# Patient Record
Sex: Male | Born: 1935 | Race: White | Hispanic: No | Marital: Married | Smoking: Former smoker
Health system: Southern US, Community
[De-identification: ages and names within clinical notes are randomized; demographics above are authoritative.]

## PROBLEM LIST (undated history)

## (undated) DIAGNOSIS — C801 Malignant (primary) neoplasm, unspecified: Secondary | ICD-10-CM

## (undated) DIAGNOSIS — Z95810 Presence of automatic (implantable) cardiac defibrillator: Secondary | ICD-10-CM

## (undated) DIAGNOSIS — I499 Cardiac arrhythmia, unspecified: Secondary | ICD-10-CM

## (undated) HISTORY — PX: PORTACATH PLACEMENT: SHX2246

## (undated) HISTORY — PX: PORT-A-CATH REMOVAL: SHX5289

## (undated) HISTORY — PX: HERNIA REPAIR: SHX51

---

## 2011-10-18 HISTORY — PX: COLON SURGERY: SHX602

## 2013-10-17 DIAGNOSIS — Z95810 Presence of automatic (implantable) cardiac defibrillator: Secondary | ICD-10-CM

## 2013-10-17 HISTORY — DX: Presence of automatic (implantable) cardiac defibrillator: Z95.810

## 2013-11-13 HISTORY — PX: OTHER SURGICAL HISTORY: SHX169

## 2014-10-20 DIAGNOSIS — C182 Malignant neoplasm of ascending colon: Secondary | ICD-10-CM | POA: Diagnosis not present

## 2014-10-21 DIAGNOSIS — C787 Secondary malignant neoplasm of liver and intrahepatic bile duct: Secondary | ICD-10-CM | POA: Diagnosis not present

## 2014-10-21 DIAGNOSIS — C7800 Secondary malignant neoplasm of unspecified lung: Secondary | ICD-10-CM | POA: Diagnosis not present

## 2014-10-21 DIAGNOSIS — C182 Malignant neoplasm of ascending colon: Secondary | ICD-10-CM | POA: Diagnosis not present

## 2014-11-03 DIAGNOSIS — Z01818 Encounter for other preprocedural examination: Secondary | ICD-10-CM | POA: Diagnosis not present

## 2014-11-03 DIAGNOSIS — C182 Malignant neoplasm of ascending colon: Secondary | ICD-10-CM | POA: Diagnosis not present

## 2014-11-04 DIAGNOSIS — C7800 Secondary malignant neoplasm of unspecified lung: Secondary | ICD-10-CM | POA: Diagnosis not present

## 2014-11-04 DIAGNOSIS — C182 Malignant neoplasm of ascending colon: Secondary | ICD-10-CM | POA: Diagnosis not present

## 2014-11-04 DIAGNOSIS — C787 Secondary malignant neoplasm of liver and intrahepatic bile duct: Secondary | ICD-10-CM | POA: Diagnosis not present

## 2014-11-11 DIAGNOSIS — C787 Secondary malignant neoplasm of liver and intrahepatic bile duct: Secondary | ICD-10-CM | POA: Diagnosis not present

## 2014-11-11 DIAGNOSIS — C182 Malignant neoplasm of ascending colon: Secondary | ICD-10-CM | POA: Diagnosis not present

## 2014-11-11 DIAGNOSIS — I4891 Unspecified atrial fibrillation: Secondary | ICD-10-CM | POA: Diagnosis not present

## 2014-11-18 DIAGNOSIS — C787 Secondary malignant neoplasm of liver and intrahepatic bile duct: Secondary | ICD-10-CM | POA: Diagnosis not present

## 2014-11-18 DIAGNOSIS — C7800 Secondary malignant neoplasm of unspecified lung: Secondary | ICD-10-CM | POA: Diagnosis not present

## 2014-11-18 DIAGNOSIS — C182 Malignant neoplasm of ascending colon: Secondary | ICD-10-CM | POA: Diagnosis not present

## 2014-12-01 DIAGNOSIS — Z950 Presence of cardiac pacemaker: Secondary | ICD-10-CM | POA: Diagnosis not present

## 2014-12-01 DIAGNOSIS — C182 Malignant neoplasm of ascending colon: Secondary | ICD-10-CM | POA: Diagnosis not present

## 2014-12-02 DIAGNOSIS — C182 Malignant neoplasm of ascending colon: Secondary | ICD-10-CM | POA: Diagnosis not present

## 2014-12-02 DIAGNOSIS — C787 Secondary malignant neoplasm of liver and intrahepatic bile duct: Secondary | ICD-10-CM | POA: Diagnosis not present

## 2014-12-02 DIAGNOSIS — R197 Diarrhea, unspecified: Secondary | ICD-10-CM | POA: Diagnosis not present

## 2014-12-02 DIAGNOSIS — C7800 Secondary malignant neoplasm of unspecified lung: Secondary | ICD-10-CM | POA: Diagnosis not present

## 2014-12-16 DIAGNOSIS — C189 Malignant neoplasm of colon, unspecified: Secondary | ICD-10-CM | POA: Diagnosis not present

## 2014-12-16 DIAGNOSIS — C7800 Secondary malignant neoplasm of unspecified lung: Secondary | ICD-10-CM | POA: Diagnosis not present

## 2014-12-16 DIAGNOSIS — Z950 Presence of cardiac pacemaker: Secondary | ICD-10-CM | POA: Diagnosis not present

## 2014-12-16 DIAGNOSIS — C787 Secondary malignant neoplasm of liver and intrahepatic bile duct: Secondary | ICD-10-CM | POA: Diagnosis not present

## 2014-12-18 DIAGNOSIS — C182 Malignant neoplasm of ascending colon: Secondary | ICD-10-CM | POA: Diagnosis not present

## 2014-12-24 DIAGNOSIS — C787 Secondary malignant neoplasm of liver and intrahepatic bile duct: Secondary | ICD-10-CM | POA: Diagnosis not present

## 2014-12-24 DIAGNOSIS — C7800 Secondary malignant neoplasm of unspecified lung: Secondary | ICD-10-CM | POA: Diagnosis not present

## 2014-12-24 DIAGNOSIS — Z950 Presence of cardiac pacemaker: Secondary | ICD-10-CM | POA: Diagnosis not present

## 2014-12-24 DIAGNOSIS — C182 Malignant neoplasm of ascending colon: Secondary | ICD-10-CM | POA: Diagnosis not present

## 2015-01-07 DIAGNOSIS — R21 Rash and other nonspecific skin eruption: Secondary | ICD-10-CM | POA: Diagnosis not present

## 2015-01-07 DIAGNOSIS — C182 Malignant neoplasm of ascending colon: Secondary | ICD-10-CM | POA: Diagnosis not present

## 2015-01-07 DIAGNOSIS — C787 Secondary malignant neoplasm of liver and intrahepatic bile duct: Secondary | ICD-10-CM | POA: Diagnosis not present

## 2015-01-13 DIAGNOSIS — I498 Other specified cardiac arrhythmias: Secondary | ICD-10-CM | POA: Diagnosis not present

## 2015-01-21 DIAGNOSIS — C182 Malignant neoplasm of ascending colon: Secondary | ICD-10-CM | POA: Diagnosis not present

## 2015-01-21 DIAGNOSIS — C787 Secondary malignant neoplasm of liver and intrahepatic bile duct: Secondary | ICD-10-CM | POA: Diagnosis not present

## 2015-02-04 DIAGNOSIS — R197 Diarrhea, unspecified: Secondary | ICD-10-CM | POA: Diagnosis not present

## 2015-02-04 DIAGNOSIS — C182 Malignant neoplasm of ascending colon: Secondary | ICD-10-CM | POA: Diagnosis not present

## 2015-02-04 DIAGNOSIS — C787 Secondary malignant neoplasm of liver and intrahepatic bile duct: Secondary | ICD-10-CM | POA: Diagnosis not present

## 2015-02-11 DIAGNOSIS — C189 Malignant neoplasm of colon, unspecified: Secondary | ICD-10-CM | POA: Diagnosis not present

## 2015-02-11 DIAGNOSIS — R918 Other nonspecific abnormal finding of lung field: Secondary | ICD-10-CM | POA: Diagnosis not present

## 2015-02-11 DIAGNOSIS — C182 Malignant neoplasm of ascending colon: Secondary | ICD-10-CM | POA: Diagnosis not present

## 2015-02-11 DIAGNOSIS — N400 Enlarged prostate without lower urinary tract symptoms: Secondary | ICD-10-CM | POA: Diagnosis not present

## 2015-02-11 DIAGNOSIS — C787 Secondary malignant neoplasm of liver and intrahepatic bile duct: Secondary | ICD-10-CM | POA: Diagnosis not present

## 2015-02-12 DIAGNOSIS — C787 Secondary malignant neoplasm of liver and intrahepatic bile duct: Secondary | ICD-10-CM | POA: Diagnosis not present

## 2015-02-12 DIAGNOSIS — C182 Malignant neoplasm of ascending colon: Secondary | ICD-10-CM | POA: Diagnosis not present

## 2015-02-18 DIAGNOSIS — C182 Malignant neoplasm of ascending colon: Secondary | ICD-10-CM | POA: Diagnosis not present

## 2015-02-18 DIAGNOSIS — Z5111 Encounter for antineoplastic chemotherapy: Secondary | ICD-10-CM | POA: Diagnosis not present

## 2015-02-18 DIAGNOSIS — C787 Secondary malignant neoplasm of liver and intrahepatic bile duct: Secondary | ICD-10-CM | POA: Diagnosis not present

## 2015-03-04 DIAGNOSIS — C182 Malignant neoplasm of ascending colon: Secondary | ICD-10-CM | POA: Diagnosis not present

## 2015-03-04 DIAGNOSIS — R197 Diarrhea, unspecified: Secondary | ICD-10-CM | POA: Diagnosis not present

## 2015-03-04 DIAGNOSIS — C787 Secondary malignant neoplasm of liver and intrahepatic bile duct: Secondary | ICD-10-CM | POA: Diagnosis not present

## 2015-03-11 DIAGNOSIS — C787 Secondary malignant neoplasm of liver and intrahepatic bile duct: Secondary | ICD-10-CM | POA: Diagnosis not present

## 2015-03-11 DIAGNOSIS — R197 Diarrhea, unspecified: Secondary | ICD-10-CM | POA: Diagnosis not present

## 2015-03-11 DIAGNOSIS — C182 Malignant neoplasm of ascending colon: Secondary | ICD-10-CM | POA: Diagnosis not present

## 2015-03-18 DIAGNOSIS — C787 Secondary malignant neoplasm of liver and intrahepatic bile duct: Secondary | ICD-10-CM | POA: Diagnosis not present

## 2015-03-18 DIAGNOSIS — C182 Malignant neoplasm of ascending colon: Secondary | ICD-10-CM | POA: Diagnosis not present

## 2015-03-18 DIAGNOSIS — D509 Iron deficiency anemia, unspecified: Secondary | ICD-10-CM | POA: Diagnosis not present

## 2015-04-01 DIAGNOSIS — C787 Secondary malignant neoplasm of liver and intrahepatic bile duct: Secondary | ICD-10-CM | POA: Diagnosis not present

## 2015-04-01 DIAGNOSIS — D649 Anemia, unspecified: Secondary | ICD-10-CM | POA: Diagnosis not present

## 2015-04-01 DIAGNOSIS — C182 Malignant neoplasm of ascending colon: Secondary | ICD-10-CM | POA: Diagnosis not present

## 2015-04-07 DIAGNOSIS — C182 Malignant neoplasm of ascending colon: Secondary | ICD-10-CM | POA: Diagnosis not present

## 2015-04-07 DIAGNOSIS — C787 Secondary malignant neoplasm of liver and intrahepatic bile duct: Secondary | ICD-10-CM | POA: Diagnosis not present

## 2015-04-07 DIAGNOSIS — I7090 Unspecified atherosclerosis: Secondary | ICD-10-CM | POA: Diagnosis not present

## 2015-04-07 DIAGNOSIS — Z85038 Personal history of other malignant neoplasm of large intestine: Secondary | ICD-10-CM | POA: Diagnosis not present

## 2015-04-08 DIAGNOSIS — C787 Secondary malignant neoplasm of liver and intrahepatic bile duct: Secondary | ICD-10-CM | POA: Diagnosis not present

## 2015-04-08 DIAGNOSIS — C182 Malignant neoplasm of ascending colon: Secondary | ICD-10-CM | POA: Diagnosis not present

## 2015-04-14 DIAGNOSIS — H2513 Age-related nuclear cataract, bilateral: Secondary | ICD-10-CM | POA: Diagnosis not present

## 2015-04-14 DIAGNOSIS — H5203 Hypermetropia, bilateral: Secondary | ICD-10-CM | POA: Diagnosis not present

## 2015-04-15 DIAGNOSIS — C19 Malignant neoplasm of rectosigmoid junction: Secondary | ICD-10-CM | POA: Diagnosis not present

## 2015-04-15 DIAGNOSIS — C182 Malignant neoplasm of ascending colon: Secondary | ICD-10-CM | POA: Diagnosis not present

## 2015-04-15 DIAGNOSIS — C787 Secondary malignant neoplasm of liver and intrahepatic bile duct: Secondary | ICD-10-CM | POA: Diagnosis not present

## 2015-04-16 DIAGNOSIS — Z5111 Encounter for antineoplastic chemotherapy: Secondary | ICD-10-CM | POA: Diagnosis not present

## 2015-04-16 DIAGNOSIS — C182 Malignant neoplasm of ascending colon: Secondary | ICD-10-CM | POA: Diagnosis not present

## 2015-04-16 DIAGNOSIS — C787 Secondary malignant neoplasm of liver and intrahepatic bile duct: Secondary | ICD-10-CM | POA: Diagnosis not present

## 2015-04-17 DIAGNOSIS — Z4502 Encounter for adjustment and management of automatic implantable cardiac defibrillator: Secondary | ICD-10-CM | POA: Diagnosis not present

## 2015-04-17 DIAGNOSIS — I498 Other specified cardiac arrhythmias: Secondary | ICD-10-CM | POA: Diagnosis not present

## 2015-04-29 DIAGNOSIS — C182 Malignant neoplasm of ascending colon: Secondary | ICD-10-CM | POA: Diagnosis not present

## 2015-04-29 DIAGNOSIS — C787 Secondary malignant neoplasm of liver and intrahepatic bile duct: Secondary | ICD-10-CM | POA: Diagnosis not present

## 2015-04-30 DIAGNOSIS — C182 Malignant neoplasm of ascending colon: Secondary | ICD-10-CM | POA: Diagnosis not present

## 2015-04-30 DIAGNOSIS — C787 Secondary malignant neoplasm of liver and intrahepatic bile duct: Secondary | ICD-10-CM | POA: Diagnosis not present

## 2015-04-30 DIAGNOSIS — Z5111 Encounter for antineoplastic chemotherapy: Secondary | ICD-10-CM | POA: Diagnosis not present

## 2015-05-14 DIAGNOSIS — C182 Malignant neoplasm of ascending colon: Secondary | ICD-10-CM | POA: Diagnosis not present

## 2015-05-14 DIAGNOSIS — C787 Secondary malignant neoplasm of liver and intrahepatic bile duct: Secondary | ICD-10-CM | POA: Diagnosis not present

## 2015-05-28 DIAGNOSIS — R21 Rash and other nonspecific skin eruption: Secondary | ICD-10-CM | POA: Diagnosis not present

## 2015-05-28 DIAGNOSIS — C787 Secondary malignant neoplasm of liver and intrahepatic bile duct: Secondary | ICD-10-CM | POA: Diagnosis not present

## 2015-05-28 DIAGNOSIS — C182 Malignant neoplasm of ascending colon: Secondary | ICD-10-CM | POA: Diagnosis not present

## 2015-06-02 DIAGNOSIS — R918 Other nonspecific abnormal finding of lung field: Secondary | ICD-10-CM | POA: Diagnosis not present

## 2015-06-02 DIAGNOSIS — C801 Malignant (primary) neoplasm, unspecified: Secondary | ICD-10-CM | POA: Diagnosis not present

## 2015-06-02 DIAGNOSIS — C787 Secondary malignant neoplasm of liver and intrahepatic bile duct: Secondary | ICD-10-CM | POA: Diagnosis not present

## 2015-06-02 DIAGNOSIS — C182 Malignant neoplasm of ascending colon: Secondary | ICD-10-CM | POA: Diagnosis not present

## 2015-06-03 DIAGNOSIS — C787 Secondary malignant neoplasm of liver and intrahepatic bile duct: Secondary | ICD-10-CM | POA: Diagnosis not present

## 2015-06-03 DIAGNOSIS — R21 Rash and other nonspecific skin eruption: Secondary | ICD-10-CM | POA: Diagnosis not present

## 2015-06-03 DIAGNOSIS — C182 Malignant neoplasm of ascending colon: Secondary | ICD-10-CM | POA: Diagnosis not present

## 2015-06-17 DIAGNOSIS — C787 Secondary malignant neoplasm of liver and intrahepatic bile duct: Secondary | ICD-10-CM | POA: Diagnosis not present

## 2015-06-17 DIAGNOSIS — C182 Malignant neoplasm of ascending colon: Secondary | ICD-10-CM | POA: Diagnosis not present

## 2015-06-19 DIAGNOSIS — C182 Malignant neoplasm of ascending colon: Secondary | ICD-10-CM | POA: Diagnosis not present

## 2015-06-19 DIAGNOSIS — Z452 Encounter for adjustment and management of vascular access device: Secondary | ICD-10-CM | POA: Diagnosis not present

## 2015-06-19 DIAGNOSIS — C787 Secondary malignant neoplasm of liver and intrahepatic bile duct: Secondary | ICD-10-CM | POA: Diagnosis not present

## 2015-07-08 DIAGNOSIS — C182 Malignant neoplasm of ascending colon: Secondary | ICD-10-CM | POA: Diagnosis not present

## 2015-07-08 DIAGNOSIS — C787 Secondary malignant neoplasm of liver and intrahepatic bile duct: Secondary | ICD-10-CM | POA: Diagnosis not present

## 2015-07-10 DIAGNOSIS — Z452 Encounter for adjustment and management of vascular access device: Secondary | ICD-10-CM | POA: Diagnosis not present

## 2015-07-10 DIAGNOSIS — C787 Secondary malignant neoplasm of liver and intrahepatic bile duct: Secondary | ICD-10-CM | POA: Diagnosis not present

## 2015-07-10 DIAGNOSIS — C182 Malignant neoplasm of ascending colon: Secondary | ICD-10-CM | POA: Diagnosis not present

## 2015-07-17 DIAGNOSIS — C182 Malignant neoplasm of ascending colon: Secondary | ICD-10-CM | POA: Diagnosis not present

## 2015-07-17 DIAGNOSIS — Z23 Encounter for immunization: Secondary | ICD-10-CM | POA: Diagnosis not present

## 2015-07-29 DIAGNOSIS — C182 Malignant neoplasm of ascending colon: Secondary | ICD-10-CM | POA: Diagnosis not present

## 2015-07-29 DIAGNOSIS — C787 Secondary malignant neoplasm of liver and intrahepatic bile duct: Secondary | ICD-10-CM | POA: Diagnosis not present

## 2015-07-31 DIAGNOSIS — C182 Malignant neoplasm of ascending colon: Secondary | ICD-10-CM | POA: Diagnosis not present

## 2015-07-31 DIAGNOSIS — Z452 Encounter for adjustment and management of vascular access device: Secondary | ICD-10-CM | POA: Diagnosis not present

## 2015-07-31 DIAGNOSIS — C787 Secondary malignant neoplasm of liver and intrahepatic bile duct: Secondary | ICD-10-CM | POA: Diagnosis not present

## 2015-08-17 DIAGNOSIS — C182 Malignant neoplasm of ascending colon: Secondary | ICD-10-CM | POA: Diagnosis not present

## 2015-08-19 DIAGNOSIS — C787 Secondary malignant neoplasm of liver and intrahepatic bile duct: Secondary | ICD-10-CM | POA: Diagnosis not present

## 2015-08-19 DIAGNOSIS — C182 Malignant neoplasm of ascending colon: Secondary | ICD-10-CM | POA: Diagnosis not present

## 2015-08-21 DIAGNOSIS — Z452 Encounter for adjustment and management of vascular access device: Secondary | ICD-10-CM | POA: Diagnosis not present

## 2015-08-21 DIAGNOSIS — C182 Malignant neoplasm of ascending colon: Secondary | ICD-10-CM | POA: Diagnosis not present

## 2015-08-21 DIAGNOSIS — C787 Secondary malignant neoplasm of liver and intrahepatic bile duct: Secondary | ICD-10-CM | POA: Diagnosis not present

## 2015-09-14 DIAGNOSIS — R918 Other nonspecific abnormal finding of lung field: Secondary | ICD-10-CM | POA: Diagnosis not present

## 2015-09-14 DIAGNOSIS — J90 Pleural effusion, not elsewhere classified: Secondary | ICD-10-CM | POA: Diagnosis not present

## 2015-09-14 DIAGNOSIS — C787 Secondary malignant neoplasm of liver and intrahepatic bile duct: Secondary | ICD-10-CM | POA: Diagnosis not present

## 2015-09-14 DIAGNOSIS — R591 Generalized enlarged lymph nodes: Secondary | ICD-10-CM | POA: Diagnosis not present

## 2015-09-14 DIAGNOSIS — C182 Malignant neoplasm of ascending colon: Secondary | ICD-10-CM | POA: Diagnosis not present

## 2015-09-16 DIAGNOSIS — C787 Secondary malignant neoplasm of liver and intrahepatic bile duct: Secondary | ICD-10-CM | POA: Diagnosis not present

## 2015-09-16 DIAGNOSIS — C182 Malignant neoplasm of ascending colon: Secondary | ICD-10-CM | POA: Diagnosis not present

## 2015-09-23 DIAGNOSIS — C182 Malignant neoplasm of ascending colon: Secondary | ICD-10-CM | POA: Diagnosis not present

## 2015-09-23 DIAGNOSIS — Z5111 Encounter for antineoplastic chemotherapy: Secondary | ICD-10-CM | POA: Diagnosis not present

## 2015-09-23 DIAGNOSIS — C787 Secondary malignant neoplasm of liver and intrahepatic bile duct: Secondary | ICD-10-CM | POA: Diagnosis not present

## 2015-09-25 DIAGNOSIS — C787 Secondary malignant neoplasm of liver and intrahepatic bile duct: Secondary | ICD-10-CM | POA: Diagnosis not present

## 2015-09-25 DIAGNOSIS — C182 Malignant neoplasm of ascending colon: Secondary | ICD-10-CM | POA: Diagnosis not present

## 2015-09-25 DIAGNOSIS — Z452 Encounter for adjustment and management of vascular access device: Secondary | ICD-10-CM | POA: Diagnosis not present

## 2015-10-07 DIAGNOSIS — Z4502 Encounter for adjustment and management of automatic implantable cardiac defibrillator: Secondary | ICD-10-CM | POA: Diagnosis not present

## 2015-10-07 DIAGNOSIS — I498 Other specified cardiac arrhythmias: Secondary | ICD-10-CM | POA: Diagnosis not present

## 2015-10-14 DIAGNOSIS — C787 Secondary malignant neoplasm of liver and intrahepatic bile duct: Secondary | ICD-10-CM | POA: Diagnosis not present

## 2015-10-14 DIAGNOSIS — C182 Malignant neoplasm of ascending colon: Secondary | ICD-10-CM | POA: Diagnosis not present

## 2015-10-17 DIAGNOSIS — C182 Malignant neoplasm of ascending colon: Secondary | ICD-10-CM | POA: Diagnosis not present

## 2015-11-04 DIAGNOSIS — C182 Malignant neoplasm of ascending colon: Secondary | ICD-10-CM | POA: Diagnosis not present

## 2015-11-04 DIAGNOSIS — D649 Anemia, unspecified: Secondary | ICD-10-CM | POA: Diagnosis not present

## 2015-11-04 DIAGNOSIS — C787 Secondary malignant neoplasm of liver and intrahepatic bile duct: Secondary | ICD-10-CM | POA: Diagnosis not present

## 2015-11-04 DIAGNOSIS — R040 Epistaxis: Secondary | ICD-10-CM | POA: Diagnosis not present

## 2015-11-04 DIAGNOSIS — D701 Agranulocytosis secondary to cancer chemotherapy: Secondary | ICD-10-CM

## 2015-11-25 DIAGNOSIS — G620 Drug-induced polyneuropathy: Secondary | ICD-10-CM | POA: Diagnosis not present

## 2015-11-25 DIAGNOSIS — C787 Secondary malignant neoplasm of liver and intrahepatic bile duct: Secondary | ICD-10-CM | POA: Diagnosis not present

## 2015-11-25 DIAGNOSIS — C189 Malignant neoplasm of colon, unspecified: Secondary | ICD-10-CM | POA: Diagnosis not present

## 2015-12-16 DIAGNOSIS — C189 Malignant neoplasm of colon, unspecified: Secondary | ICD-10-CM | POA: Diagnosis not present

## 2015-12-16 DIAGNOSIS — C787 Secondary malignant neoplasm of liver and intrahepatic bile duct: Secondary | ICD-10-CM | POA: Diagnosis not present

## 2015-12-18 ENCOUNTER — Other Ambulatory Visit: Payer: Self-pay | Admitting: Oncology

## 2015-12-18 DIAGNOSIS — C189 Malignant neoplasm of colon, unspecified: Secondary | ICD-10-CM

## 2015-12-18 DIAGNOSIS — C787 Secondary malignant neoplasm of liver and intrahepatic bile duct: Principal | ICD-10-CM

## 2015-12-22 ENCOUNTER — Ambulatory Visit
Admission: RE | Admit: 2015-12-22 | Discharge: 2015-12-22 | Disposition: A | Discharge status: Home / Self Care | Payer: Medicare Other | Source: Ambulatory Visit | Attending: Oncology | Admitting: Oncology

## 2015-12-22 ENCOUNTER — Other Ambulatory Visit: Payer: Self-pay | Admitting: Interventional Radiology

## 2015-12-22 DIAGNOSIS — C189 Malignant neoplasm of colon, unspecified: Secondary | ICD-10-CM

## 2015-12-22 DIAGNOSIS — C787 Secondary malignant neoplasm of liver and intrahepatic bile duct: Principal | ICD-10-CM

## 2015-12-22 HISTORY — DX: Cardiac arrhythmia, unspecified: I49.9

## 2015-12-22 HISTORY — DX: Presence of automatic (implantable) cardiac defibrillator: Z95.810

## 2015-12-22 HISTORY — DX: Malignant (primary) neoplasm, unspecified: C80.1

## 2015-12-22 NOTE — Consult Note (Signed)
Chief Complaint: Patient was seen in consultation today for  Chief Complaint  Patient presents with  . Advice Only    Consult for Y-90 SIRT   at the request of Lewis,Dequincy A  Referring Physician(s): Lewis,Dequincy A  History of Present Illness: Erik Foster is a 82 y.o. male with a history of colon cancer initially diagnosed in 2014. He has hepatic metastatic disease which has been relatively well controlled as he has progressed through multiple chemotherapeutic regimens. Most recently he was on FOLFOX with Avastin (last dose approximately 3 weeks previously).  Unfortunately, his most recent surveillance study demonstrates significant progression of his hepatic disease which is now clearly no longer responding to FOLFOX based therapy. At this time, he has no additional chemotherapeutic options outside of the possibility of clinical trials.  He remains quite vivacious for his age. He is able to perform all of his activities of daily living and continues to perform more aggressive activities including yardwork and recent removal of 2 trees from his property. He is accompanied by his wife today who is very supportive. 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  Overall, he feels he has tolerated multiple rounds of chemotherapy with fairly minimal side effect and has maintained an extremely positive attitude. He is very realistic about his overall prognosis and understands that radial embolization is not a curative therapy but is rather palliative. He is at peace with his future mortality and wants to extend his life as long as possible to help his wife prepare for the time when he will no longer be around.  He denies new weight loss, fever, chills, abdominal pain, nausea or vomiting.  Past Medical History  Diagnosis Date  . Cancer ([place])   . Dysrhythmia   . AICD  (automatic cardioverter/defibrillator) present     Past Surgical History  Procedure Laterality Date  . Colon surgery  2013  . Biv icd st jude zuadra assura  11/13/2013    . Hernia repair Right     Inguinal hernia repair    Allergies: Review of patient's allergies indicates no known allergies.  Medications: Prior to Admission medications   Medication Sig Start Date End Date Taking? Authorizing Provider  amiodarone (PACERONE) 200 MG tablet Take 200 mg by mouth daily.   Yes Historical Provider, MD  apixaban (ELIQUIS) 2.5 MG TABS tablet Take 2.5 mg by mouth 2 (two) times daily.   Yes Historical Provider, MD  atorvastatin (LIPITOR) 20 MG tablet Take 20 mg by mouth daily.   Yes Historical Provider, MD  lisinopril (PRINIVIL,ZESTRIL) 2.5 MG tablet Take 2.5 mg by mouth daily.   Yes Historical Provider, MD  Multiple Vitamin (MULTIVITAMIN) tablet Take 1 tablet by mouth daily.   Yes Historical Provider, MD     No family history on file.  Social History   Social History  . Marital Status: Married    Spouse Name: N/A  . Number of Children: N/A  . Years of Education: N/A   Social History Main Topics  . Smoking status: Former Smoker -- 0.25 packs/day    Types: Cigarettes    Start date: 12/21/1953    Quit date: 12/22/1963  . Smokeless tobacco: Never Used  . Alcohol Use: No  . Drug Use: No  . Sexual Activity: Not Asked   Other Topics Concern  . None   Social History Narrative  .  None    ECOG Status: 1 - Symptomatic but completely ambulatory  Review of Systems: A 12 point ROS discussed and pertinent positives are indicated in the HPI above.  All other systems are negative.  Review of Systems  Vital Signs: BP 134/69 mmHg  Pulse 69  Temp(Src) 98.8 F (37.1 C) (Oral)  Resp 14  Ht 6\' 1"  (1.854 m)  Wt 193 lb (87.544 kg)  BMI 25.47 kg/m2  SpO2 100%  Physical Exam  Constitutional: He is oriented to person, place, and time. He appears well-developed and well-nourished. No  distress.  HENT:  Head: Atraumatic.  Eyes: No scleral icterus.  Cardiovascular: Normal rate and regular rhythm.   Pulmonary/Chest: Effort normal.  Abdominal: Soft. He exhibits no distension. There is no tenderness.  Neurological: He is alert and oriented to person, place, and time.  Skin: Skin is warm and dry.  Psychiatric: He has a normal mood and affect. His behavior is normal.     Imaging: No results found.  Labs:  CBC: No results for input(s): WBC, HGB, HCT, PLT in the last 8760 hours.  COAGS: No results for input(s): INR, APTT in the last 8760 hours.  BMP: No results for input(s): NA, K, CL, CO2, GLUCOSE, BUN, CALCIUM, CREATININE, GFRNONAA, GFRAA in the last 8760 hours.  Invalid input(s): CMP  LIVER FUNCTION TESTS: No results for input(s): BILITOT, AST, ALT, ALKPHOS, PROT, ALBUMIN in the last 8760 hours.  TUMOR MARKERS: No results for input(s): AFPTM, CEA, CA199, CHROMGRNA in the last 8760 hours.  Assessment and Plan:  Extremely pleasant and vivacious 82 year old male with a history of colon cancer metastasized to the liver. His liver metastatic disease has been well controlled for the past several years on FOLFOX based chemotherapy. However, his most recent surveillance CT scans now demonstrate progression of all sites of his liver disease. Additionally, his CEA levels have been rising.  At this time, he has no further chemotherapeutic options outside of potentially available clinical trials.   His performance status is excellent for his age. He is ECOG 1.  His hepatic reserve is also quite good. I feel he is an excellent candidate for transarterial radial embolization with Y 90. This should provide him with a significant survival benefit versus best supportive care and does not preclude him from receiving additional chemotherapy if he qualifies for a trial. His last Avastin dose was approximately 3 weeks ago.  1.) Set up for Y 90 mapping study as soon as possible.  This will be followed by a right lobar radioembolization and then a left lobar radioembolization approximately one month later.    Thank you for this interesting consult.  I greatly enjoyed meeting Erik Foster and look forward to participating in their care.  A copy of this report was sent to the requesting provider on this date.  Electronically Signed: Jacqulynn Cadet 12/22/2015, 9:28 PM   I spent a total of  40 Minutes  in face to face in clinical consultation, greater than 50% of which was counseling/coordinating care for colon cancer metastasized to liver.

## 2015-12-23 ENCOUNTER — Other Ambulatory Visit: Payer: Self-pay | Admitting: Interventional Radiology

## 2015-12-23 DIAGNOSIS — C189 Malignant neoplasm of colon, unspecified: Secondary | ICD-10-CM

## 2015-12-23 DIAGNOSIS — C787 Secondary malignant neoplasm of liver and intrahepatic bile duct: Principal | ICD-10-CM

## 2015-12-28 ENCOUNTER — Other Ambulatory Visit: Payer: Self-pay | Admitting: Interventional Radiology

## 2015-12-28 DIAGNOSIS — C787 Secondary malignant neoplasm of liver and intrahepatic bile duct: Principal | ICD-10-CM

## 2015-12-28 DIAGNOSIS — C189 Malignant neoplasm of colon, unspecified: Secondary | ICD-10-CM

## 2015-12-30 DIAGNOSIS — C189 Malignant neoplasm of colon, unspecified: Secondary | ICD-10-CM | POA: Diagnosis not present

## 2015-12-30 DIAGNOSIS — C787 Secondary malignant neoplasm of liver and intrahepatic bile duct: Secondary | ICD-10-CM | POA: Diagnosis not present

## 2016-01-04 ENCOUNTER — Other Ambulatory Visit: Payer: Self-pay | Admitting: General Surgery

## 2016-01-05 ENCOUNTER — Encounter (HOSPITAL_COMMUNITY): Payer: Self-pay

## 2016-01-05 ENCOUNTER — Ambulatory Visit (HOSPITAL_COMMUNITY)
Admission: RE | Admit: 2016-01-05 | Discharge: 2016-01-05 | Disposition: A | Discharge status: Home / Self Care | Payer: Medicare Other | Source: Ambulatory Visit | Attending: Interventional Radiology | Admitting: Interventional Radiology

## 2016-01-05 ENCOUNTER — Encounter (HOSPITAL_COMMUNITY)
Admission: RE | Admit: 2016-01-05 | Discharge: 2016-01-05 | Disposition: A | Discharge status: Home / Self Care | Payer: Medicare Other | Source: Ambulatory Visit | Attending: Interventional Radiology | Admitting: Interventional Radiology

## 2016-01-05 ENCOUNTER — Other Ambulatory Visit: Payer: Self-pay | Admitting: Interventional Radiology

## 2016-01-05 DIAGNOSIS — C787 Secondary malignant neoplasm of liver and intrahepatic bile duct: Principal | ICD-10-CM

## 2016-01-05 DIAGNOSIS — Z9221 Personal history of antineoplastic chemotherapy: Secondary | ICD-10-CM | POA: Diagnosis not present

## 2016-01-05 DIAGNOSIS — C189 Malignant neoplasm of colon, unspecified: Secondary | ICD-10-CM

## 2016-01-05 DIAGNOSIS — Z87891 Personal history of nicotine dependence: Secondary | ICD-10-CM | POA: Insufficient documentation

## 2016-01-05 DIAGNOSIS — Z95810 Presence of automatic (implantable) cardiac defibrillator: Secondary | ICD-10-CM | POA: Diagnosis not present

## 2016-01-05 LAB — COMPREHENSIVE METABOLIC PANEL
ALBUMIN: 2.9 g/dL — AB (ref 3.5–5.0)
ALT: 37 U/L (ref 17–63)
ANION GAP: 11 (ref 5–15)
AST: 47 U/L — AB (ref 15–41)
Alkaline Phosphatase: 225 U/L — ABNORMAL HIGH (ref 38–126)
BILIRUBIN TOTAL: 1 mg/dL (ref 0.3–1.2)
BUN: 22 mg/dL — ABNORMAL HIGH (ref 6–20)
CALCIUM: 9.1 mg/dL (ref 8.9–10.3)
CO2: 23 mmol/L (ref 22–32)
Chloride: 106 mmol/L (ref 101–111)
Creatinine, Ser: 1.46 mg/dL — ABNORMAL HIGH (ref 0.61–1.24)
GFR calc Af Amer: 50 mL/min — ABNORMAL LOW (ref >60)
GFR calc non Af Amer: 43 mL/min — ABNORMAL LOW (ref >60)
GLUCOSE: 112 mg/dL — AB (ref 65–99)
Potassium: 4.3 mmol/L (ref 3.5–5.1)
SODIUM: 140 mmol/L (ref 135–145)
TOTAL PROTEIN: 7.7 g/dL (ref 6.5–8.1)

## 2016-01-05 LAB — PROTIME-INR
INR: 1.17 (ref 0.00–1.49)
Prothrombin Time: 15.1 seconds (ref 11.6–15.2)

## 2016-01-05 LAB — CBC WITH DIFFERENTIAL/PLATELET
BASOS ABS: 0 10*3/uL (ref 0.0–0.1)
BASOS PCT: 0 %
EOS ABS: 0.2 10*3/uL (ref 0.0–0.7)
EOS PCT: 2 %
HEMATOCRIT: 33.5 % — AB (ref 39.0–52.0)
Hemoglobin: 10.3 g/dL — ABNORMAL LOW (ref 13.0–17.0)
Lymphocytes Relative: 12 %
Lymphs Abs: 1.2 10*3/uL (ref 0.7–4.0)
MCH: 28.5 pg (ref 26.0–34.0)
MCHC: 30.7 g/dL (ref 30.0–36.0)
MCV: 92.5 fL (ref 78.0–100.0)
MONO ABS: 0.8 10*3/uL (ref 0.1–1.0)
MONOS PCT: 8 %
Neutro Abs: 7.6 10*3/uL (ref 1.7–7.7)
Neutrophils Relative %: 78 %
PLATELETS: 224 10*3/uL (ref 150–400)
RBC: 3.62 MIL/uL — ABNORMAL LOW (ref 4.22–5.81)
RDW: 17.1 % — AB (ref 11.5–15.5)
WBC: 9.8 10*3/uL (ref 4.0–10.5)

## 2016-01-05 MED ORDER — VERAPAMIL HCL 2.5 MG/ML IV SOLN
INTRAVENOUS | Status: AC | PRN
  Administered 2016-01-05: 2.5 mg via INTRA_ARTERIAL

## 2016-01-05 MED ORDER — SODIUM CHLORIDE 0.9% FLUSH: 3.0000 mL | INTRAVENOUS | Status: DC | PRN

## 2016-01-05 MED ORDER — IODIXANOL 320 MG/ML IV SOLN
50.0000 mL | Freq: Once | INTRAVENOUS | Status: AC | PRN
  Administered 2016-01-05: 12 mL via INTRAVENOUS

## 2016-01-05 MED ORDER — HEPARIN SODIUM (PORCINE) 1000 UNIT/ML IJ SOLN
INTRAMUSCULAR | Status: AC | PRN
Start: 2016-01-05 — End: 2016-01-05
  Administered 2016-01-05: 3000 [IU] via INTRA_ARTERIAL

## 2016-01-05 MED ORDER — LIDOCAINE HCL 1 % IJ SOLN
INTRAMUSCULAR | Status: AC
  Filled 2016-01-05: qty 20

## 2016-01-05 MED ORDER — HEPARIN SODIUM (PORCINE) 1000 UNIT/ML IJ SOLN
INTRAMUSCULAR | Status: AC
  Filled 2016-01-05: qty 1

## 2016-01-05 MED ORDER — MIDAZOLAM HCL 2 MG/2ML IJ SOLN
INTRAMUSCULAR | Status: AC
  Filled 2016-01-05: qty 6

## 2016-01-05 MED ORDER — SODIUM CHLORIDE 0.9 % IV SOLN: 250.0000 mL | INTRAVENOUS | Status: DC | PRN

## 2016-01-05 MED ORDER — SODIUM CHLORIDE 0.9% FLUSH
3.0000 mL | Freq: Two times a day (BID) | INTRAVENOUS | Status: DC
Start: 2016-01-05 — End: 2016-01-06

## 2016-01-05 MED ORDER — ONDANSETRON HCL 4 MG/2ML IJ SOLN: 4.0000 mg | Freq: Four times a day (QID) | INTRAMUSCULAR | Status: DC | PRN

## 2016-01-05 MED ORDER — ACETAMINOPHEN 325 MG PO TABS
650.0000 mg | ORAL_TABLET | ORAL | Status: DC | PRN
  Filled 2016-01-05: qty 2

## 2016-01-05 MED ORDER — NITROGLYCERIN 1 MG/10 ML FOR IR/CATH LAB
INTRA_ARTERIAL | Status: AC | PRN
  Administered 2016-01-05 (×2): 200 ug via INTRA_ARTERIAL

## 2016-01-05 MED ORDER — MIDAZOLAM HCL 2 MG/2ML IJ SOLN
INTRAMUSCULAR | Status: AC | PRN
  Administered 2016-01-05: 1 mg via INTRAVENOUS
  Administered 2016-01-05: 0.5 mg via INTRAVENOUS

## 2016-01-05 MED ORDER — FENTANYL CITRATE (PF) 100 MCG/2ML IJ SOLN
INTRAMUSCULAR | Status: AC
  Filled 2016-01-05: qty 4

## 2016-01-05 MED ORDER — IODIXANOL 320 MG/ML IV SOLN
200.0000 mL/kg | Freq: Once | INTRAVENOUS | Status: AC | PRN
  Administered 2016-01-05: 50 mL via INTRAVENOUS

## 2016-01-05 MED ORDER — VERAPAMIL HCL 2.5 MG/ML IV SOLN
INTRAVENOUS | Status: AC
  Filled 2016-01-05: qty 2

## 2016-01-05 MED ORDER — IODIXANOL 320 MG/ML IV SOLN
50.0000 mL | Freq: Once | INTRAVENOUS | Status: AC | PRN
  Administered 2016-01-05: 20 mL via INTRAVENOUS

## 2016-01-05 MED ORDER — FENTANYL CITRATE (PF) 100 MCG/2ML IJ SOLN
INTRAMUSCULAR | Status: AC | PRN
  Administered 2016-01-05: 50 ug via INTRAVENOUS

## 2016-01-05 MED ORDER — SODIUM CHLORIDE 0.9 % IV SOLN: INTRAVENOUS | Status: AC

## 2016-01-05 MED ORDER — SODIUM CHLORIDE 0.9 % IV SOLN
INTRAVENOUS | Status: DC
  Administered 2016-01-05: 08:00:00 via INTRAVENOUS

## 2016-01-05 NOTE — Procedures (Signed)
Interventional Radiology Procedure Note  Procedure:  1.) Hepatic arteriogram 2.) Coil embo GDA 3.) Injection Tc-MAA  Access: LEFT radial 32F, TR band   Complications: None  Estimated Blood Loss: None  Recommendations: - To nucs for imaging - Begin TR band take down in 90 minutes - ADAT  Signed,  Criselda Peaches, MD

## 2016-01-05 NOTE — Progress Notes (Addendum)
SCD's placed at 1300. 3 cc air removed from left radial TR band by Dr. Laurence Ferrari at [phone]. Site clean, dry, intact.

## 2016-01-05 NOTE — Progress Notes (Addendum)
3 cc air (remainder of air) removed without difficulty from left radial TR band at 1424. TR band removed. Bandaid placed. Site clean, dry, intact.

## 2016-01-05 NOTE — Sedation Documentation (Signed)
L wrist TR band unremarkable, CDI.  <3 sec capillary refill in thumb nailbed.

## 2016-01-05 NOTE — Sedation Documentation (Signed)
Patient denies pain and is resting comfortably.  

## 2016-01-05 NOTE — Sedation Documentation (Addendum)
L wrist TR band unremarkable, CDI.  <3 sec capillary refill in thumb nailbed.

## 2016-01-05 NOTE — Sedation Documentation (Signed)
Post imaging in Nuc Med compete.  Pt to be transported to [facility] for recovery.

## 2016-01-05 NOTE — Progress Notes (Signed)
3 cc air removed from left radial TR band at 1409. Site clean, dry, intact.

## 2016-01-05 NOTE — H&P (Signed)
Chief Complaint: colon carcinoma with liver met  Referring Physician:Dr. Lavera Guise  Supervising Physician: Jacqulynn Cadet  HPI: Erik Foster is an 82 y.o. male with a history of colon cancer that was diagnosed in 2014.  He developed liver metastases and has been treated with chemo, mostly recently FOLFOX and Avastin.  He was referred to Dr. Laurence Ferrari for evaluation for Y-90 treatment.  He is here today for the first part of the treatment which is the mapping study.    Past Medical History:  Past Medical History  Diagnosis Date  . Cancer ([place])   . Dysrhythmia   . AICD (automatic cardioverter/defibrillator) present 2015    Past Surgical History:  Past Surgical History  Procedure Laterality Date  . Colon surgery  2013  . Biv icd st jude zuadra assura  11/13/2013    . Hernia repair Right     Inguinal hernia repair    Family History: History reviewed. No pertinent family history.  Social History:  reports that he quit smoking about 52 years ago. His smoking use included Cigarettes. He started smoking about 62 years ago. He smoked 0.25 packs per day. He has never used smokeless tobacco. He reports that he does not drink alcohol or use illicit drugs.  Allergies: No Known Allergies  Medications:   Medication List    ASK your doctor about these medications        amiodarone 200 MG tablet  Commonly known as:  PACERONE  Take 200 mg by mouth daily.     atorvastatin 20 MG tablet  Commonly known as:  LIPITOR  Take 20 mg by mouth daily.     ELIQUIS 2.5 MG Tabs tablet  Generic drug:  apixaban  Take 2.5 mg by mouth 2 (two) times daily.     lisinopril 2.5 MG tablet  Commonly known as:  PRINIVIL,ZESTRIL  Take 2.5 mg by mouth daily.     multivitamin tablet  Take 1 tablet by mouth daily. Pt has been drinking boost instead        Please HPI for pertinent positives, otherwise complete 10 system ROS negative.  Mallampati Score: MD Evaluation Airway: WNL Heart:  Other (comments) Heart  comments: [place], has not been activated Abdomen: WNL Chest/ Lungs: WNL ASA  Classification: 3 Mallampati/Airway Score: Two  Physical Exam: BP 124/74 mmHg  Pulse 85  Temp(Src) 98.1 F (36.7 C) (Oral)  Resp 20  SpO2 97% There is no weight on file to calculate BMI. General: pleasant, WD, WN white male who is laying in bed in NAD HEENT: head is normocephalic, atraumatic.  Sclera are noninjected.  PERRL.  Ears and nose without any masses or lesions.  Mouth is pink and moist Heart: regular, rate, and rhythm.  Normal s1,s2. No obvious murmurs, gallops, or rubs noted.  Palpable radial and pedal pulses bilaterally.  AICD noted in left upper chest, PAC in right upper chest Lungs: CTAB, no wheezes, rhonchi, or rales noted.  Respiratory effort nonlabored Abd: soft, NT, ND, +BS, no masses, hernias, or organomegaly MS: all 4 extremities are symmetrical with no cyanosis, clubbing, or edema. Skin: warm and dry with no masses, lesions, or rashes Psych: A&Ox3 with an appropriate affect.   Labs: Results for orders placed or performed during the hospital encounter of 01/05/16 (from the past 48 hour(s))  CBC with Differential/Platelet     Status: Abnormal   Collection Time: 01/05/16  7:40 AM  Result Value Ref Range   WBC 9.8 4.0 -  10.5 K/uL   RBC 3.62 (L) 4.22 - 5.81 MIL/uL   Hemoglobin 10.3 (L) 13.0 - 17.0 g/dL   HCT 33.5 (L) 39.0 - 52.0 %   MCV 92.5 78.0 - 100.0 fL   MCH 28.5 26.0 - 34.0 pg   MCHC 30.7 30.0 - 36.0 g/dL   RDW 17.1 (H) 11.5 - 15.5 %   Platelets 224 150 - 400 K/uL   Neutrophils Relative % 78 %   Neutro Abs 7.6 1.7 - 7.7 K/uL   Lymphocytes Relative 12 %   Lymphs Abs 1.2 0.7 - 4.0 K/uL   Monocytes Relative 8 %   Monocytes Absolute 0.8 0.1 - 1.0 K/uL   Eosinophils Relative 2 %   Eosinophils Absolute 0.2 0.0 - 0.7 K/uL   Basophils Relative 0 %   Basophils Absolute 0.0 0.0 - 0.1 K/uL  Comprehensive metabolic panel     Status: Abnormal    Collection Time: 01/05/16  7:40 AM  Result Value Ref Range   Sodium 140 135 - 145 mmol/L   Potassium 4.3 3.5 - 5.1 mmol/L   Chloride 106 101 - 111 mmol/L   CO2 23 22 - 32 mmol/L   Glucose, Bld 112 (H) 65 - 99 mg/dL   BUN 22 (H) 6 - 20 mg/dL   Creatinine, Ser 1.46 (H) 0.61 - 1.24 mg/dL   Calcium 9.1 8.9 - 10.3 mg/dL   Total Protein 7.7 6.5 - 8.1 g/dL   Albumin 2.9 (L) 3.5 - 5.0 g/dL   AST 47 (H) 15 - 41 U/L   ALT 37 17 - 63 U/L   Alkaline Phosphatase 225 (H) 38 - 126 U/L   Total Bilirubin 1.0 0.3 - 1.2 mg/dL   GFR calc non Af Amer 43 (L) >60 mL/min   GFR calc Af Amer 50 (L) >60 mL/min    Comment: (NOTE) The eGFR has been calculated using the CKD EPI equation. This calculation has not been validated in all clinical situations. eGFR's persistently <60 mL/min signify possible Chronic Kidney Disease.    Anion gap 11 5 - 15  Protime-INR     Status: None   Collection Time: 01/05/16  7:40 AM  Result Value Ref Range   Prothrombin Time 15.1 11.6 - 15.2 seconds   INR 1.17 0.00 - 1.49    Imaging: No results found.  Assessment/Plan 1. Colon cancer with liver met -will proceed with Y90 mapping today -labs have been reviewed and [place] test has been completed.  He is an A.  Will likely proceed with left radial approach if US shows a large enough diameter.  If it is not large enough, then will proceed in groin. -Risks and Benefits discussed with the patient including, but not limited to bleeding, infection, vascular injury or contrast induced renal failure. All of the patient's questions were answered, patient is agreeable to proceed. Consent signed and in chart.   Thank you for this interesting consult.  I greatly enjoyed meeting Erik Foster and look forward to participating in their care.  A copy of this report was sent to the requesting provider on this date.  Electronically Signed: Henreitta Cea 01/05/2016, 8:50 AM   I spent a total of    30 minutes in face to face in  clinical consultation, greater than 50% of which was counseling/coordinating care for colon cancer with liver mets, Y90 mapping

## 2016-01-05 NOTE — Discharge Instructions (Signed)
Keep wrist elevated for 24 hours  Remove bandaid in 24 hours  May resume Eliquis tomorrow 01/06/16  Post Y-90 Radioembolization Discharge Instructions  You have been given a radioactive material during your procedure.  While it is safe for you to be discharged home from the hospital, you need to proceed directly home.    Do not use public transportation, including air travel, lasting more than 2 hours for 1 week.  Avoid crowded public places for 1 week.  Adult visitors should try to avoid close contact with you for 1 week.    Children and pregnant females should not visit or have close contact with you for 1 week.  Items that you touch are not radioactive.  Do not sleep in the same bed as your partner for 1 week, and a condom should be used for sexual activity during the first 24 hours.  Your blood may be radioactive and caution should be used if any bleeding occurs during the recovery period.  Body fluids may be radioactive for 24 hours.  Wash your hands after voiding.  Men should sit to urinate.  Dispose of any soiled materials (flush down toilet or place in trash at home) during the first day.  Drink 6 to 8 glasses of fluids per day for 5 days to hydrate yourself.  If you need to see a doctor during the first week, you must let them know that you were treated with yttrium-90 microspheres, and will be slightly radioactive.  They can call Interventional Radiology [phone] with any questions   .Angiogram, Care After These instructions give you information about caring for yourself after your procedure. Your doctor may also give you more specific instructions. Call your doctor if you have any problems or questions after your procedure.  HOME CARE  Take medicines only as told by your doctor.  Follow your doctor's instructions about:  Care of the area where the tube was inserted.  Bandage (dressing) changes and removal.  You may shower 24-48 hours after the procedure or as told  by your doctor.  Do not take baths, swim, or use a hot tub until your doctor approves.  Every day, check the area where the tube was inserted. Watch for:  Redness, swelling, or pain.  Fluid, blood, or pus.  Do not apply powder or lotion to the site.  Do not lift anything that is heavier than 10 lb (4.5 kg) for 5 days or as told by your doctor.  Ask your doctor when you can:  Return to work or school.  Do physical activities or play sports.  Have sex.  Do not drive or operate heavy machinery for 24 hours or as told by your doctor.  Have someone with you for the first 24 hours after the procedure.  Keep all follow-up visits as told by your doctor. This is important. GET HELP IF:  You have a fever.   You have chills.   You have more bleeding from the area where the tube was inserted. Hold pressure on the area.  You have redness, swelling, or pain in the area where the tube was inserted.  You have fluid or pus coming from the area. GET HELP RIGHT AWAY IF:   You have a lot of pain in the area where the tube was inserted.  The area where the tube was inserted is bleeding, and the bleeding does not stop after 30 minutes of holding steady pressure on the area.  The area near or just  beyond the insertion site becomes pale, cool, tingly, or numb.   This information is not intended to replace advice given to you by your health care provider. Make sure you discuss any questions you have with your health care provider.   Document Released: 12/30/2008 Document Revised: 10/24/2014 Document Reviewed: 03/06/2013 Elsevier Interactive Patient Education 2016 [employer] Moderate Conscious Sedation, Adult, Care After Refer to this sheet in the next few weeks. These instructions provide you with information on caring for yourself after your procedure. Your health care provider may also give you more specific instructions. Your treatment has been planned according to current medical  practices, but problems sometimes occur. Call your health care provider if you have any problems or questions after your procedure. WHAT TO EXPECT AFTER THE PROCEDURE  After your procedure:  You may feel sleepy, clumsy, and have poor balance for several hours.  Vomiting may occur if you eat too soon after the procedure. HOME CARE INSTRUCTIONS  Do not participate in any activities where you could become injured for at least 24 hours. Do not:  Drive.  Swim.  Ride a bicycle.  Operate heavy machinery.  Cook.  Use power tools.  Climb ladders.  Work from a high place.  Do not make important decisions or sign legal documents until you are improved.  If you vomit, drink water, juice, or soup when you can drink without vomiting. Make sure you have little or no nausea before eating solid foods.  Only take over-the-counter or prescription medicines for pain, discomfort, or fever as directed by your health care provider.  Make sure you and your family fully understand everything about the medicines given to you, including what side effects may occur.  You should not drink alcohol, take sleeping pills, or take medicines that cause drowsiness for at least 24 hours.  If you smoke, do not smoke without supervision.  If you are feeling better, you may resume normal activities 24 hours after you were sedated.  Keep all appointments with your health care provider. SEEK MEDICAL CARE IF:  Your skin is pale or bluish in color.  You continue to feel nauseous or vomit.  Your pain is getting worse and is not helped by medicine.  You have bleeding or swelling.  You are still sleepy or feeling clumsy after 24 hours. SEEK IMMEDIATE MEDICAL CARE IF:  You develop a rash.  You have difficulty breathing.  You develop any type of allergic problem.  You have a fever. MAKE SURE YOU:  Understand these instructions.  Will watch your condition.  Will get help right away if you are not  doing well or get worse.   This information is not intended to replace advice given to you by your health care provider. Make sure you discuss any questions you have with your health care provider.   Document Released: 07/24/2013 Document Revised: 10/24/2014 Document Reviewed: 07/24/2013 Elsevier Interactive Patient Education [employer].

## 2016-01-05 NOTE — Sedation Documentation (Signed)
5Fr sheath removed from L Radial artery by Dr. Laurence Ferrari.  Hemostasis achieved using TR Band.  Band inflated with 10-12cc air. Site unremarkable. Wrist stabilized with arm board.

## 2016-01-06 LAB — CEA: CEA: 480.6 ng/mL — ABNORMAL HIGH (ref 0.0–4.7)

## 2016-01-21 ENCOUNTER — Other Ambulatory Visit: Payer: Self-pay | Admitting: Radiology

## 2016-01-22 ENCOUNTER — Other Ambulatory Visit: Payer: Self-pay | Admitting: Radiology

## 2016-01-25 ENCOUNTER — Encounter (HOSPITAL_COMMUNITY)
Admission: RE | Admit: 2016-01-25 | Discharge: 2016-01-25 | Disposition: A | Discharge status: Home / Self Care | Payer: Medicare Other | Source: Ambulatory Visit | Attending: Interventional Radiology | Admitting: Interventional Radiology

## 2016-01-25 ENCOUNTER — Other Ambulatory Visit: Payer: Self-pay | Admitting: Interventional Radiology

## 2016-01-25 ENCOUNTER — Encounter (HOSPITAL_COMMUNITY): Payer: Self-pay

## 2016-01-25 ENCOUNTER — Ambulatory Visit (HOSPITAL_COMMUNITY)
Admission: RE | Admit: 2016-01-25 | Discharge: 2016-01-25 | Disposition: A | Discharge status: Home / Self Care | Payer: Medicare Other | Source: Ambulatory Visit | Attending: Interventional Radiology | Admitting: Interventional Radiology

## 2016-01-25 DIAGNOSIS — C787 Secondary malignant neoplasm of liver and intrahepatic bile duct: Principal | ICD-10-CM

## 2016-01-25 DIAGNOSIS — Z79899 Other long term (current) drug therapy: Secondary | ICD-10-CM | POA: Insufficient documentation

## 2016-01-25 DIAGNOSIS — Z95810 Presence of automatic (implantable) cardiac defibrillator: Secondary | ICD-10-CM | POA: Insufficient documentation

## 2016-01-25 DIAGNOSIS — C189 Malignant neoplasm of colon, unspecified: Secondary | ICD-10-CM

## 2016-01-25 DIAGNOSIS — I10 Essential (primary) hypertension: Secondary | ICD-10-CM | POA: Insufficient documentation

## 2016-01-25 DIAGNOSIS — Z510 Encounter for antineoplastic radiation therapy: Secondary | ICD-10-CM | POA: Insufficient documentation

## 2016-01-25 DIAGNOSIS — Z87891 Personal history of nicotine dependence: Secondary | ICD-10-CM | POA: Diagnosis not present

## 2016-01-25 DIAGNOSIS — Z7901 Long term (current) use of anticoagulants: Secondary | ICD-10-CM | POA: Diagnosis not present

## 2016-01-25 LAB — COMPREHENSIVE METABOLIC PANEL
ALBUMIN: 2.4 g/dL — AB (ref 3.5–5.0)
ALK PHOS: 255 U/L — AB (ref 38–126)
ALT: 55 U/L (ref 17–63)
AST: 62 U/L — AB (ref 15–41)
Anion gap: 11 (ref 5–15)
BILIRUBIN TOTAL: 1.3 mg/dL — AB (ref 0.3–1.2)
BUN: 21 mg/dL — AB (ref 6–20)
CO2: 22 mmol/L (ref 22–32)
CREATININE: 1.29 mg/dL — AB (ref 0.61–1.24)
Calcium: 8.4 mg/dL — ABNORMAL LOW (ref 8.9–10.3)
Chloride: 105 mmol/L (ref 101–111)
GFR calc Af Amer: 58 mL/min — ABNORMAL LOW (ref >60)
GFR, EST NON AFRICAN AMERICAN: 50 mL/min — AB (ref >60)
GLUCOSE: 88 mg/dL (ref 65–99)
Potassium: 4 mmol/L (ref 3.5–5.1)
Sodium: 138 mmol/L (ref 135–145)
TOTAL PROTEIN: 6.6 g/dL (ref 6.5–8.1)

## 2016-01-25 LAB — PROTIME-INR
INR: 1.38 (ref 0.00–1.49)
PROTHROMBIN TIME: 16.6 s — AB (ref 11.6–15.2)

## 2016-01-25 LAB — CBC WITH DIFFERENTIAL/PLATELET
BASOS ABS: 0 10*3/uL (ref 0.0–0.1)
BASOS PCT: 0 %
Eosinophils Absolute: 0.1 10*3/uL (ref 0.0–0.7)
Eosinophils Relative: 1 %
HEMATOCRIT: 28.5 % — AB (ref 39.0–52.0)
HEMOGLOBIN: 8.9 g/dL — AB (ref 13.0–17.0)
Lymphocytes Relative: 11 %
Lymphs Abs: 1 10*3/uL (ref 0.7–4.0)
MCH: 27.9 pg (ref 26.0–34.0)
MCHC: 31.2 g/dL (ref 30.0–36.0)
MCV: 89.3 fL (ref 78.0–100.0)
MONOS PCT: 8 %
Monocytes Absolute: 0.8 10*3/uL (ref 0.1–1.0)
NEUTROS ABS: 7.1 10*3/uL (ref 1.7–7.7)
NEUTROS PCT: 80 %
Platelets: 222 10*3/uL (ref 150–400)
RBC: 3.19 MIL/uL — AB (ref 4.22–5.81)
RDW: 17.7 % — ABNORMAL HIGH (ref 11.5–15.5)
WBC: 9 10*3/uL (ref 4.0–10.5)

## 2016-01-25 MED ORDER — MIDAZOLAM HCL 2 MG/2ML IJ SOLN
INTRAMUSCULAR | Status: AC
  Filled 2016-01-25: qty 4

## 2016-01-25 MED ORDER — OXYCODONE-ACETAMINOPHEN 5-325 MG PO TABS: 1.0000 | ORAL_TABLET | ORAL | Status: DC | PRN

## 2016-01-25 MED ORDER — HEPARIN SODIUM (PORCINE) 1000 UNIT/ML IJ SOLN
INTRAMUSCULAR | Status: AC | PRN
  Administered 2016-01-25: 3000 [IU] via INTRA_ARTERIAL

## 2016-01-25 MED ORDER — SODIUM CHLORIDE 0.9 % IV SOLN: INTRAVENOUS | Status: AC

## 2016-01-25 MED ORDER — SODIUM CHLORIDE 0.9 % IV SOLN: 250.0000 mL | INTRAVENOUS | Status: DC | PRN

## 2016-01-25 MED ORDER — HEPARIN SOD (PORK) LOCK FLUSH 100 UNIT/ML IV SOLN
500.0000 [IU] | Freq: Once | INTRAVENOUS | Status: AC
  Administered 2016-01-25: 500 [IU] via INTRAVENOUS
  Filled 2016-01-25: qty 5

## 2016-01-25 MED ORDER — ACETAMINOPHEN 325 MG PO TABS
650.0000 mg | ORAL_TABLET | ORAL | Status: DC | PRN
  Filled 2016-01-25: qty 2

## 2016-01-25 MED ORDER — IOPAMIDOL (ISOVUE-300) INJECTION 61%
100.0000 mL | Freq: Once | INTRAVENOUS | Status: AC | PRN
  Administered 2016-01-25: 30 mL via INTRA_ARTERIAL

## 2016-01-25 MED ORDER — SODIUM CHLORIDE 0.9% FLUSH: 3.0000 mL | INTRAVENOUS | Status: DC | PRN

## 2016-01-25 MED ORDER — YTTRIUM 90 INJECTION
36.5000 | INJECTION | Freq: Once | INTRAVENOUS | Status: AC
  Administered 2016-01-25: 34.68 via INTRAVENOUS

## 2016-01-25 MED ORDER — NITROGLYCERIN 1 MG/10 ML FOR IR/CATH LAB
INTRA_ARTERIAL | Status: AC | PRN
  Administered 2016-01-25 (×2): 200 ug via INTRA_ARTERIAL

## 2016-01-25 MED ORDER — ONDANSETRON HCL 4 MG/2ML IJ SOLN
4.0000 mg | Freq: Once | INTRAMUSCULAR | Status: AC
  Administered 2016-01-25: 4 mg via INTRAVENOUS
  Filled 2016-01-25: qty 2

## 2016-01-25 MED ORDER — HEPARIN SODIUM (PORCINE) 1000 UNIT/ML IJ SOLN
INTRAMUSCULAR | Status: AC
  Filled 2016-01-25: qty 1

## 2016-01-25 MED ORDER — MIDAZOLAM HCL 2 MG/2ML IJ SOLN
INTRAMUSCULAR | Status: AC | PRN
  Administered 2016-01-25: 0.5 mg via INTRAVENOUS

## 2016-01-25 MED ORDER — PIPERACILLIN-TAZOBACTAM 3.375 G IVPB
3.3750 g | INTRAVENOUS | Status: AC
  Administered 2016-01-25: 3.375 g via INTRAVENOUS
  Filled 2016-01-25: qty 50

## 2016-01-25 MED ORDER — FENTANYL CITRATE (PF) 100 MCG/2ML IJ SOLN
INTRAMUSCULAR | Status: AC | PRN
  Administered 2016-01-25: 25 ug via INTRAVENOUS

## 2016-01-25 MED ORDER — DEXAMETHASONE SODIUM PHOSPHATE 10 MG/ML IJ SOLN
10.0000 mg | Freq: Once | INTRAMUSCULAR | Status: AC
  Administered 2016-01-25: 10 mg via INTRAVENOUS
  Filled 2016-01-25: qty 1

## 2016-01-25 MED ORDER — ONDANSETRON HCL 4 MG/2ML IJ SOLN: 4.0000 mg | Freq: Four times a day (QID) | INTRAMUSCULAR | Status: DC | PRN

## 2016-01-25 MED ORDER — SODIUM CHLORIDE 0.9% FLUSH: 3.0000 mL | Freq: Two times a day (BID) | INTRAVENOUS | Status: DC

## 2016-01-25 MED ORDER — PANTOPRAZOLE SODIUM 40 MG IV SOLR
40.0000 mg | Freq: Once | INTRAVENOUS | Status: AC
  Administered 2016-01-25: 40 mg via INTRAVENOUS
  Filled 2016-01-25: qty 40

## 2016-01-25 MED ORDER — VERAPAMIL HCL 2.5 MG/ML IV SOLN
INTRAVENOUS | Status: AC | PRN
  Administered 2016-01-25: 2.5 mg via INTRA_ARTERIAL

## 2016-01-25 MED ORDER — LIDOCAINE HCL 1 % IJ SOLN
INTRAMUSCULAR | Status: AC
  Filled 2016-01-25: qty 20

## 2016-01-25 MED ORDER — VERAPAMIL HCL 2.5 MG/ML IV SOLN
INTRAVENOUS | Status: AC
  Filled 2016-01-25: qty 2

## 2016-01-25 MED ORDER — SODIUM CHLORIDE 0.9 % IV SOLN
INTRAVENOUS | Status: DC
  Administered 2016-01-25: 09:00:00 via INTRAVENOUS

## 2016-01-25 MED ORDER — FENTANYL CITRATE (PF) 100 MCG/2ML IJ SOLN
INTRAMUSCULAR | Status: AC
  Filled 2016-01-25: qty 2

## 2016-01-25 NOTE — Sedation Documentation (Signed)
Patient denies pain and is resting comfortably.  

## 2016-01-25 NOTE — Sedation Documentation (Signed)
66fr sheath removed from left radial artery by Dr. Laurence Ferrari. Hemostasis achieved using TR band for 90 minutes post procedure. Left radial pulse +3.

## 2016-01-25 NOTE — Discharge Instructions (Signed)
Post Y-90 Radioembolization Discharge Instructions  You have been given a radioactive material during your procedure.  While it is safe for you to be discharged home from the hospital, you need to proceed directly home.    Do not use public transportation, including air travel, lasting more than 2 hours for 1 week.  Avoid crowded public places for 1 week.  Adult visitors should try to avoid close contact with you for 1 week.    Children and pregnant females should not visit or have close contact with you for 1 week.  Items that you touch are not radioactive.  Do not sleep in the same bed as your partner for 1 week, and a condom should be used for sexual activity during the first 24 hours.  Your blood may be radioactive and caution should be used if any bleeding occurs during the recovery period.  Body fluids may be radioactive for 24 hours.  Wash your hands after voiding.  Men should sit to urinate.  Dispose of any soiled materials (flush down toilet or place in trash at home) during the first day.  Drink 6 to 8 glasses of fluids per day for 5 days to hydrate yourself.  If you need to see a doctor during the first week, you must let them know that you were treated with yttrium-90 microspheres, and will be slightly radioactive.  They can call Interventional Radiology [phone] with any questions.Moderate Conscious Sedation, Adult, Care After Refer to this sheet in the next few weeks. These instructions provide you with information on caring for yourself after your procedure. Your health care provider may also give you more specific instructions. Your treatment has been planned according to current medical practices, but problems sometimes occur. Call your health care provider if you have any problems or questions after your procedure. WHAT TO EXPECT AFTER THE PROCEDURE  After your procedure:  You may feel sleepy, clumsy, and have poor balance for several hours.  Vomiting may occur if you  eat too soon after the procedure. HOME CARE INSTRUCTIONS  Do not participate in any activities where you could become injured for at least 24 hours. Do not:  Drive.  Swim.  Ride a bicycle.  Operate heavy machinery.  Cook.  Use power tools.  Climb ladders.  Work from a high place.  Do not make important decisions or sign legal documents until you are improved.  If you vomit, drink water, juice, or soup when you can drink without vomiting. Make sure you have little or no nausea before eating solid foods.  Only take over-the-counter or prescription medicines for pain, discomfort, or fever as directed by your health care provider.  Make sure you and your family fully understand everything about the medicines given to you, including what side effects may occur.  You should not drink alcohol, take sleeping pills, or take medicines that cause drowsiness for at least 24 hours.  If you smoke, do not smoke without supervision.  If you are feeling better, you may resume normal activities 24 hours after you were sedated.  Keep all appointments with your health care provider. SEEK MEDICAL CARE IF:  Your skin is pale or bluish in color.  You continue to feel nauseous or vomit.  Your pain is getting worse and is not helped by medicine.  You have bleeding or swelling.  You are still sleepy or feeling clumsy after 24 hours. SEEK IMMEDIATE MEDICAL CARE IF:  You develop a rash.  You have difficulty breathing.  You develop any type of allergic problem.  You have a fever. MAKE SURE YOU:  Understand these instructions.  Will watch your condition.  Will get help right away if you are not doing well or get worse.   This information is not intended to replace advice given to you by your health care provider. Make sure you discuss any questions you have with your health care provider.   Document Released: 07/24/2013 Document Revised: 10/24/2014 Document Reviewed:  07/24/2013 Elsevier Interactive Patient Education 2016 [place] Refer to this sheet in the next few weeks. These instructions provide you with information about caring for yourself after your procedure. Your health care provider may also give you more specific instructions. Your treatment has been planned according to current medical practices, but problems sometimes occur. Call your health care provider if you have any problems or questions after your procedure. WHAT TO EXPECT AFTER THE PROCEDURE After your procedure, it is typical to have the following:  Bruising at the radial site that usually fades within 1-2 weeks.  Blood collecting in the tissue (hematoma) that may be painful to the touch. It should usually decrease in size and tenderness within 1-2 weeks. HOME CARE INSTRUCTIONS  Take medicines only as directed by your health care provider.  You may shower 24-48 hours after the procedure or as directed by your health care provider. Remove the bandage (dressing) and gently wash the site with plain soap and water. Pat the area dry with a clean towel. Do not rub the site, because this may cause bleeding.  Do not take baths, swim, or use a hot tub until your health care provider approves.  Check your insertion site every day for redness, swelling, or drainage.  Do not apply powder or lotion to the site.  Do not flex or bend the affected arm for 24 hours or as directed by your health care provider.  Do not push or pull heavy objects with the affected arm for 24 hours or as directed by your health care provider.  Do not lift over 10 lb (4.5 kg) for 5 days after your procedure or as directed by your health care provider.  Ask your health care provider when it is okay to:  Return to work or school.  Resume usual physical activities or sports.  Resume sexual activity.  Do not drive home if you are discharged the same day as the procedure. Have someone else  drive you.  You may drive 24 hours after the procedure unless otherwise instructed by your health care provider.  Do not operate machinery or power tools for 24 hours after the procedure.  If your procedure was done as an outpatient procedure, which means that you went home the same day as your procedure, a responsible adult should be with you for the first 24 hours after you arrive home.  Keep all follow-up visits as directed by your health care provider. This is important. SEEK MEDICAL CARE IF:  You have a fever.  You have chills.  You have increased bleeding from the radial site. Hold pressure on the site. SEEK IMMEDIATE MEDICAL CARE IF:  You have unusual pain at the radial site.  You have redness, warmth, or swelling at the radial site.  You have drainage (other than a small amount of blood on the dressing) from the radial site.  The radial site is bleeding, and the bleeding does not stop after 30 minutes of holding steady pressure on  the site.  Your arm or hand becomes pale, cool, tingly, or numb.   This information is not intended to replace advice given to you by your health care provider. Make sure you discuss any questions you have with your health care provider.   Document Released: 11/05/2010 Document Revised: 10/24/2014 Document Reviewed: 04/21/2014 Elsevier Interactive Patient Education [employer].

## 2016-01-25 NOTE — H&P (Signed)
Chief Complaint: colon cancer with liver mets, s/p Y-90 roadmapping  Referring Physician: Dr. Lavera Guise  Supervising Physician: Jacqulynn Cadet  HPI: Erik Foster is an 82 y.o. male with a history of colon cancer diagnosed in 2014.  He has received multiple chemotherapies.  He was here just about 2-3 weeks ago for a Y-90 roadmapping procedure.  He presents today to complete this with the radioembolization portion.  He has been feeling well otherwise, with no new complaints  Past Medical History:  Past Medical History  Diagnosis Date  . Cancer ([place])   . Dysrhythmia   . AICD (automatic cardioverter/defibrillator) present 2015    Past Surgical History:  Past Surgical History  Procedure Laterality Date  . Colon surgery  2013  . Biv icd st jude zuadra assura  11/13/2013    . Hernia repair Right     Inguinal hernia repair    Family History: History reviewed. No pertinent family history.  Social History:  reports that he quit smoking about 52 years ago. His smoking use included Cigarettes. He started smoking about 62 years ago. He smoked 0.25 packs per day. He has never used smokeless tobacco. He reports that he does not drink alcohol or use illicit drugs.  Allergies: No Known Allergies  Medications:   Medication List    ASK your doctor about these medications        acetaminophen 325 MG tablet  Commonly known as:  TYLENOL  Take 650 mg by mouth every 6 (six) hours as needed.     amiodarone 200 MG tablet  Commonly known as:  PACERONE  Take 200 mg by mouth daily.     atorvastatin 20 MG tablet  Commonly known as:  LIPITOR  Take 20 mg by mouth daily.     ELIQUIS 2.5 MG Tabs tablet  Generic drug:  apixaban  Take 2.5 mg by mouth 2 (two) times daily.     feeding supplement Liqd  Take 1 Container by mouth 3 (three) times daily between meals.     lisinopril 2.5 MG tablet  Commonly known as:  PRINIVIL,ZESTRIL  Take 2.5 mg by mouth daily.     multivitamin  tablet  Take 1 tablet by mouth daily. Pt has been drinking boost instead        Please HPI for pertinent positives, otherwise complete 10 system ROS negative.  Mallampati Score: MD Evaluation Airway: WNL Heart: WNL Heart  comments: has AICD Abdomen: WNL Chest/ Lungs: WNL ASA  Classification: 3 Mallampati/Airway Score: Two  Physical Exam: BP 125/69 mmHg  Pulse 70  Temp(Src) 97.5 F (36.4 C) (Oral)  Resp 18  Ht 6\' 1"  (1.854 m)  Wt 192 lb 3.2 oz (87.181 kg)  BMI 25.36 kg/m2  SpO2 96% Body mass index is 25.36 kg/(m^2). General: pleasant, WD, WN white male who is laying in bed in NAD HEENT: head is normocephalic, atraumatic.  Sclera are noninjected.  PERRL.  Ears and nose without any masses or lesions.  Mouth is pink and moist Heart: regular, rate, and rhythm.  Normal s1,s2. No obvious murmurs, gallops, or rubs noted.  Palpable radial and pedal pulses bilaterally.  Pacemaker in left chest.  Port a cath in right chest Lungs: CTAB, no wheezes, rhonchi, or rales noted.  Respiratory effort nonlabored Abd: soft, NT, ND, +BS, no masses, hernias, or organomegaly MS: all 4 extremities are symmetrical with no cyanosis, clubbing, or edema. Psych: A&Ox3 with an appropriate affect.   Labs: Results for orders placed or  performed during the hospital encounter of 01/25/16 (from the past 48 hour(s))  CBC with Differential/Platelet     Status: Abnormal   Collection Time: 01/25/16  9:00 AM  Result Value Ref Range   WBC 9.0 4.0 - 10.5 K/uL   RBC 3.19 (L) 4.22 - 5.81 MIL/uL   Hemoglobin 8.9 (L) 13.0 - 17.0 g/dL   HCT 28.5 (L) 39.0 - 52.0 %   MCV 89.3 78.0 - 100.0 fL   MCH 27.9 26.0 - 34.0 pg   MCHC 31.2 30.0 - 36.0 g/dL   RDW 17.7 (H) 11.5 - 15.5 %   Platelets 222 150 - 400 K/uL   Neutrophils Relative % 80 %   Neutro Abs 7.1 1.7 - 7.7 K/uL   Lymphocytes Relative 11 %   Lymphs Abs 1.0 0.7 - 4.0 K/uL   Monocytes Relative 8 %   Monocytes Absolute 0.8 0.1 - 1.0 K/uL   Eosinophils Relative  1 %   Eosinophils Absolute 0.1 0.0 - 0.7 K/uL   Basophils Relative 0 %   Basophils Absolute 0.0 0.0 - 0.1 K/uL  Protime-INR     Status: Abnormal   Collection Time: 01/25/16  9:00 AM  Result Value Ref Range   Prothrombin Time 16.6 (H) 11.6 - 15.2 seconds   INR 1.38 0.00 - 1.49    Imaging: No results found.  Assessment/Plan 1. Colon cancer with liver mets -[place] A.  Will plan for left radial access approach -patient does take eliquis.  He last took this on 4/7. -labs and vitals have been reviewed -Risks and Benefits discussed with the patient including, but not limited to bleeding, infection, vascular injury, post procedural pain, nausea, vomiting and fatigue, contrast induced renal failure, liver failure, radiation injury to the bowel, radiation induced cholecystitis, neutropenia and possible need for additional procedures. All of the patient's questions were answered, patient is agreeable to proceed. Consent signed and in chart.    Thank you for this interesting consult.  I greatly enjoyed meeting Bry Campoverde and look forward to participating in their care.  A copy of this report was sent to the requesting provider on this date.  Electronically Signed: Henreitta Cea 01/25/2016, 9:48 AM   I spent a total of    25 Minutes in face to face in clinical consultation, greater than 50% of which was counseling/coordinating care for colon cancer with liver mets, here for y-90

## 2016-01-25 NOTE — Procedures (Signed)
Interventional Radiology Procedure Note  Procedure: TARE with Y90, right lobar treatment.  Access: Left radial, 79F slender --> TR band  Complications: None  Estimated Blood Loss: 0  Recommendations: - To nucs for imaging - TR band take-down per protocol - DC home   Signed,  Criselda Peaches, MD

## 2016-01-26 ENCOUNTER — Other Ambulatory Visit (HOSPITAL_COMMUNITY): Payer: Self-pay | Admitting: Interventional Radiology

## 2016-01-26 ENCOUNTER — Other Ambulatory Visit: Payer: Self-pay | Admitting: *Deleted

## 2016-01-26 ENCOUNTER — Other Ambulatory Visit: Payer: Self-pay | Admitting: Oncology

## 2016-01-26 DIAGNOSIS — C787 Secondary malignant neoplasm of liver and intrahepatic bile duct: Principal | ICD-10-CM

## 2016-01-26 DIAGNOSIS — C189 Malignant neoplasm of colon, unspecified: Secondary | ICD-10-CM

## 2016-02-09 LAB — PROTIME-INR
INR: 1.25 (ref <1.50)
PROTHROMBIN TIME: 15.9 s — AB (ref 11.6–15.2)

## 2016-02-09 LAB — CBC
HCT: 30 % — ABNORMAL LOW (ref 38.5–50.0)
Hemoglobin: 9.1 g/dL — ABNORMAL LOW (ref 13.2–17.1)
MCH: 26.6 pg — AB (ref 27.0–33.0)
MCHC: 30.3 g/dL — AB (ref 32.0–36.0)
MCV: 87.7 fL (ref 80.0–100.0)
MPV: 9.2 fL (ref 7.5–12.5)
PLATELETS: 273 10*3/uL (ref 140–400)
RBC: 3.42 MIL/uL — AB (ref 4.20–5.80)
RDW: 18 % — AB (ref 11.0–15.0)
WBC: 9.4 10*3/uL (ref 3.8–10.8)

## 2016-02-10 LAB — COMPLETE METABOLIC PANEL WITH GFR
ALBUMIN: 2.5 g/dL — AB (ref 3.6–5.1)
ALK PHOS: 240 U/L — AB (ref 40–115)
ALT: 23 U/L (ref 9–46)
AST: 31 U/L (ref 10–35)
BILIRUBIN TOTAL: 1.3 mg/dL — AB (ref 0.2–1.2)
BUN: 15 mg/dL (ref 7–25)
CO2: 25 mmol/L (ref 20–31)
CREATININE: 1.2 mg/dL — AB (ref 0.70–1.11)
Calcium: 8.3 mg/dL — ABNORMAL LOW (ref 8.6–10.3)
Chloride: 103 mmol/L (ref 98–110)
GFR, Est African American: 65 mL/min (ref >=60)
GFR, Est Non African American: 56 mL/min — ABNORMAL LOW (ref >=60)
GLUCOSE: 75 mg/dL (ref 65–99)
Potassium: 4.7 mmol/L (ref 3.5–5.3)
SODIUM: 139 mmol/L (ref 135–146)
TOTAL PROTEIN: 5.7 g/dL — AB (ref 6.1–8.1)

## 2016-02-10 LAB — CEA: CEA: 360.4 ng/mL — ABNORMAL HIGH

## 2016-02-11 ENCOUNTER — Ambulatory Visit
Admission: RE | Admit: 2016-02-11 | Discharge: 2016-02-11 | Disposition: A | Discharge status: Home / Self Care | Payer: Medicare Other | Source: Ambulatory Visit | Attending: Interventional Radiology | Admitting: Interventional Radiology

## 2016-02-11 DIAGNOSIS — C787 Secondary malignant neoplasm of liver and intrahepatic bile duct: Principal | ICD-10-CM

## 2016-02-11 DIAGNOSIS — C189 Malignant neoplasm of colon, unspecified: Secondary | ICD-10-CM

## 2016-02-11 NOTE — Progress Notes (Signed)
Patient ID: Erik Foster, male   DOB: 12/03/1931, 82 y.o.   MRN: [id number]   Referring Physician(s): Lewis,Dequincy  Chief Complaint: The patient is seen in follow up today s/p successful right hepatic lobe transarterial Y-90 radiombolization for chemotherapy refractive metastatic colon cancer on 01/25/16  History of present illness: Erik Foster is an 82 year old white male, patient of Dr. Lavera Guise, with history of metastatic colon cancer which had progressed despite chemotherapy. On 01/25/16 he underwent successful right hepatic lobe Y 90 radioembolization by Dr. Laurence Ferrari. He presents again today for routine outpatient follow-up. Since the above procedure the patient has done markedly well. The first several days post procedure he did experience some increased fatigue, occasional night sweats and occasional dizziness. This resolved within approximately a week posttreatment. He currently denies fever, headache, chest pain, dyspnea, cough nausea, vomiting or abnormal bleeding. He denies any difficulties with voiding or bowel movements. He states that his appetite is fair to good and fatigue is improving. He currently uses a walker to assist with ambulation.  He did have a recent fall at home and has had some mild left flank discomfort since that time but no imaging studies were performed. He states the pain is not worsening. He was seen by his oncologist Dr. Bobby Rumpf yesterday. Follow-up laboratory studies include CEA 360, down from 481 approximately 1 month ago, creatinine 1.2, total bilirubin stable at 1.3, alkaline phosphatase 240 down from 255, WBC 8.4, hemoglobin 9.0, platelets 222K.    Past Medical History  Diagnosis Date  . Cancer ([place])   . Dysrhythmia   . AICD (automatic cardioverter/defibrillator) present 2015    Past Surgical History  Procedure Laterality Date  . Colon surgery  2013  . Biv icd st jude zuadra assura  11/13/2013    . Hernia repair Right     Inguinal hernia repair     Allergies: Review of patient's allergies indicates no known allergies.  Medications: Prior to Admission medications   Medication Sig Start Date End Date Taking? Authorizing Provider  acetaminophen (TYLENOL) 325 MG tablet Take 650 mg by mouth every 6 (six) hours as needed.   Yes Historical Provider, MD  amiodarone (PACERONE) 200 MG tablet Take 200 mg by mouth daily.   Yes Historical Provider, MD  apixaban (ELIQUIS) 2.5 MG TABS tablet Take 2.5 mg by mouth 2 (two) times daily.   Yes Historical Provider, MD  atorvastatin (LIPITOR) 20 MG tablet Take 20 mg by mouth daily.   Yes Historical Provider, MD  feeding supplement (BOOST HIGH PROTEIN) LIQD Take 1 Container by mouth 3 (three) times daily between meals.   Yes Historical Provider, MD  lisinopril (PRINIVIL,ZESTRIL) 2.5 MG tablet Take 2.5 mg by mouth daily.   Yes Historical Provider, MD  Multiple Vitamin (MULTIVITAMIN) tablet Take 1 tablet by mouth daily. Pt has been drinking boost instead   Yes Historical Provider, MD  oxyCODONE-acetaminophen (PERCOCET) 10-650 MG tablet Take 1 tablet by mouth every 6 (six) hours as needed for pain.   Yes Historical Provider, MD     No family history on file.  Social History   Social History  . Marital Status: Married    Spouse Name: N/A  . Number of Children: N/A  . Years of Education: N/A   Social History Main Topics  . Smoking status: Former Smoker -- 0.25 packs/day    Types: Cigarettes    Start date: 12/21/1953    Quit date: 12/22/1963  . Smokeless tobacco: Never Used  . Alcohol Use: No  .  Drug Use: No  . Sexual Activity: Not on file   Other Topics Concern  . Not on file   Social History Narrative     Vital Signs: BP 117/64 mmHg  Pulse 70  Temp(Src) 98.1 F (36.7 C) (Oral)  Resp 14  Ht 6\' 1"  (1.854 m)  Wt 194 lb (87.998 kg)  BMI 25.60 kg/m2  SpO2 97%  Physical Exam patient awake, alert. Chest clear to auscultation bilaterally. Clean, intact right chest wall Port-A-Cath and  left chest wall AICD; heart with regular rate and rhythm. Abdomen soft, positive bowel sounds, nontender. Mild left upper flank tenderness to palpation but no obvious hematoma. 1-2+ bilateral lower extremity edema. Left radial artery access site clean, dry, nontender, intact pulse.  Imaging: No results found.  Labs:  CBC:  Recent Labs  01/05/16 0740 01/25/16 0900 02/09/16 1214  WBC 9.8 9.0 9.4  HGB 10.3* 8.9* 9.1*  HCT 33.5* 28.5* 30.0*  PLT 224 222 273    COAGS:  Recent Labs  01/05/16 0740 01/25/16 0900 02/09/16 1214  INR 1.17 1.38 1.25    BMP:  Recent Labs  01/05/16 0740 01/25/16 0900 02/09/16 1214  NA 140 138 139  K 4.3 4.0 4.7  CL 106 105 103  CO2 23 22 25   GLUCOSE 112* 88 75  BUN 22* 21* 15  CALCIUM 9.1 8.4* 8.3*  CREATININE 1.46* 1.29* 1.20*  GFRNONAA 43* 50* 56*  GFRAA 50* 58* 65    LIVER FUNCTION TESTS:  Recent Labs  01/05/16 0740 01/25/16 0900 02/09/16 1214  BILITOT 1.0 1.3* 1.3*  AST 47* 62* 31  ALT 37 55 23  ALKPHOS 225* 255* 240*  PROT 7.7 6.6 5.7*  ALBUMIN 2.9* 2.4* 2.5*    Assessment: Patient with history of chemotherapy refractive metastatic colon cancer, status post successful right hepatic lobe Y 90 radio embolization on 01/25/16. Patient currently stable with improved LFTs and CEA. Plan at this time is to coordinate scheduling of Y 90 radio embolization of left hepatic lobe. We will contact patient for date and time of procedure. He was told to contact our service in the interim with any additional questions or concerns.   Signed: D. Rowe Robert 02/11/2016, 11:36 AM   Please refer to Dr. Katrinka Blazing attestation of this note for management and plan.

## 2016-02-15 ENCOUNTER — Other Ambulatory Visit (HOSPITAL_COMMUNITY): Payer: Self-pay | Admitting: Interventional Radiology

## 2016-02-15 ENCOUNTER — Telehealth: Payer: Self-pay | Admitting: *Deleted

## 2016-02-15 DIAGNOSIS — C189 Malignant neoplasm of colon, unspecified: Secondary | ICD-10-CM

## 2016-02-15 DIAGNOSIS — C787 Secondary malignant neoplasm of liver and intrahepatic bile duct: Principal | ICD-10-CM

## 2016-02-15 NOTE — Telephone Encounter (Signed)
Mr. Mccalla wife called and states Mr. Farbman back on lft side has been hurting for a while. Urinates during the day but not much at night. No fever. Took 2 Oxycodone yesterday last one at 1am. He is scheduled for Y-90 on his lft side Thur, Feb 18, 2016. I have sent Dr. Laurence Ferrari a msg./vm

## 2016-02-16 ENCOUNTER — Ambulatory Visit
Admission: RE | Admit: 2016-02-16 | Discharge: 2016-02-16 | Disposition: A | Discharge status: Home / Self Care | Payer: Medicare Other | Source: Ambulatory Visit | Attending: Interventional Radiology | Admitting: Interventional Radiology

## 2016-02-16 ENCOUNTER — Other Ambulatory Visit (HOSPITAL_COMMUNITY): Payer: Self-pay | Admitting: Interventional Radiology

## 2016-02-16 ENCOUNTER — Other Ambulatory Visit: Payer: Self-pay | Admitting: Radiology

## 2016-02-16 DIAGNOSIS — C189 Malignant neoplasm of colon, unspecified: Secondary | ICD-10-CM

## 2016-02-16 DIAGNOSIS — C787 Secondary malignant neoplasm of liver and intrahepatic bile duct: Principal | ICD-10-CM

## 2016-02-16 DIAGNOSIS — M5442 Lumbago with sciatica, left side: Secondary | ICD-10-CM

## 2016-02-16 HISTORY — PX: IR GENERIC HISTORICAL: IMG1180011

## 2016-02-16 NOTE — Progress Notes (Signed)
Patient ID: Erik Foster, male   DOB: September 29, 1932, 82 y.o.   MRN: [id number]       Chief Complaint: Left upper back and flank pain, status post right hepatic Y 90 radio embolization 01/25/2016.   Referring Physician(s): Arlyn Buerkle  History of Present Illness: Erik Foster is a 82 y.o. male with metastatic colon cancer and progression despite chemotherapy. He underwent right hepatic Y 90 radio embolization 01/25/2016 by Dr. Laurence Ferrari. Overall he has done very well this last month. No current fever, headache, chest pain, shortness of breath, cough, nausea or vomiting. No change in urinary or bowel habits. He has persistent left lower back and flank pain. He reports a recent fall in the bathroom with trauma to the area. This has responded to pain medications. Pain is slowly improving but he is concerned about this before his second Y 90 procedure later this week. Otherwise he is doing very well.  Past Medical History  Diagnosis Date  . Cancer ([place])   . Dysrhythmia   . AICD (automatic cardioverter/defibrillator) present 2015    Past Surgical History  Procedure Laterality Date  . Colon surgery  2013  . Biv icd st jude zuadra assura  11/13/2013    . Hernia repair Right     Inguinal hernia repair    Allergies: Review of patient's allergies indicates no known allergies.  Medications: Prior to Admission medications   Medication Sig Start Date End Date Taking? Authorizing Provider  acetaminophen (TYLENOL) 325 MG tablet Take 650 mg by mouth every 6 (six) hours as needed.    Historical Provider, MD  amiodarone (PACERONE) 200 MG tablet Take 200 mg by mouth daily.    Historical Provider, MD  apixaban (ELIQUIS) 2.5 MG TABS tablet Take 2.5 mg by mouth 2 (two) times daily.    Historical Provider, MD  atorvastatin (LIPITOR) 20 MG tablet Take 20 mg by mouth daily.    Historical Provider, MD  feeding supplement (BOOST HIGH PROTEIN) LIQD Take 1 Container by mouth 3 (three) times daily between  meals.    Historical Provider, MD  lisinopril (PRINIVIL,ZESTRIL) 2.5 MG tablet Take 2.5 mg by mouth daily.    Historical Provider, MD  Multiple Vitamin (MULTIVITAMIN) tablet Take 1 tablet by mouth daily. Pt has been drinking boost instead    Historical Provider, MD  oxyCODONE-acetaminophen (PERCOCET) 10-650 MG tablet Take 1 tablet by mouth every 6 (six) hours as needed for pain.    Historical Provider, MD     No family history on file.  Social History   Social History  . Marital Status: Married    Spouse Name: N/A  . Number of Children: N/A  . Years of Education: N/A   Social History Main Topics  . Smoking status: Former Smoker -- 0.25 packs/day    Types: Cigarettes    Start date: 12/21/1953    Quit date: 12/22/1963  . Smokeless tobacco: Never Used  . Alcohol Use: No  . Drug Use: No  . Sexual Activity: Not on file   Other Topics Concern  . Not on file   Social History Narrative    ECOG Status: 2 - Symptomatic, <50% confined to bed  Review of Systems: A 12 point ROS discussed and pertinent positives are indicated in the HPI above.  All other systems are negative.  Review of Systems  Vital Signs: BP 124/64, heart rate 73, respiratory rate 14, temp 97.9, 97% on room air.  Physical Exam  Constitutional:  Frail elderly male with a walker. No  current distress.  Cardiovascular: Normal rate and regular rhythm.  Exam reveals no friction rub.   No murmur heard. Pulmonary/Chest: Effort normal and breath sounds normal. No respiratory distress.  Abdominal: Soft. Bowel sounds are normal. He exhibits no distension and no mass. There is no tenderness.  Musculoskeletal:  Mild left CVA tenderness  Skin: No rash noted. No erythema.  Psychiatric: He has a normal mood and affect. His behavior is normal.    Mallampati Score:   2  Imaging: Dg Ribs Unilateral W/chest Left  02/16/2016  CLINICAL DATA:  Recent fall in bathroom, left posterior flank and rib pain. EXAM: LEFT RIBS AND  CHEST - 3+ VIEW COMPARISON:  09/14/2013 FINDINGS: Right IJ port catheter tip proximal SVC. Left subclavian 2 lead pacer noted. Normal heart size vascularity. Minor increased bibasilar atelectasis. Bilateral lower lobe pulmonary nodules by CT comparison or difficult to visualize by plain radiography. Increased bibasilar atelectasis versus scarring. Trachea is midline. Small left effusion versus pleural thickening noted. Trachea is midline. No significant pleural effusion or pneumothorax. Degenerative changes of the spine. Left rib views demonstrate no significant displaced rib fracture or focal rib abnormality. Previous gastroduodenal artery coil embolization noted. IMPRESSION: No evidence of acute displaced left rib fracture, significant left effusion or pneumothorax. Increased bibasilar atelectasis versus scarring. Electronically Signed   By: Jerilynn Mages.  Emory Gallentine M.D.   On: 02/16/2016 13:02   Nm Liver Img Spect  01/25/2016  CLINICAL DATA:  Metastatic colon cancer. First treatment to right lobe. EXAM: NUCLEAR MEDICINE SPECIAL MED RAD PHYSICS CONS; NUCLEAR MEDICINE RADIO PHARM THERAPY INTRA ARTERIAL; NUCLEAR MEDICINE TREATMENT PROCEDURE; NUCLEAR MEDICINE LIVER SCAN TECHNIQUE: In conjunction with the interventional radiologist a Y- Microsphere dose was calculated utilizing body surface area formulation. Calculated dose equal 34.68 mCi. Pre therapy MAA liver SPECT scan and CTA were evaluated. Utilizing a microcatheter system, the hepatic artery was selected and Y-90 microspheres were delivered in fractionated aliquots. Radiopharmaceutical was delivered by the interventional radiologist and nuclear radiologist. The patient tolerated procedure well. No adverse effects were noted. Bremsstrahlung planar and SPECT imaging of the abdomen following intrahepatic arterial delivery of Y-90 microsphere was performed. RADIOPHARMACEUTICALS:  34.68 MCI Y90 MICROSPHERES COMPARISON:  04/07/15. FINDINGS: Y - 90 microspheres therapy as above.  First therapy the right hepatic lobe. Bremsstrahlung planar and SPECT imaging of the abdomen following intrahepatic arterial delivery of Y-48microsphere demonstrates radioactivity localized to the right hepatic lobe. No evidence of extrahepatic activity. IMPRESSION: Successful Y - 90 microsphere delivery for treatment of unresectable liver metastasis. First therapy to the right lobe. Bremssstrahlung scan demonstrates activity localized to right hepatic lobe with no extrahepatic activity identified. Electronically Signed   By: Kerby Moors M.D.   On: 01/25/2016 14:58   Ir Angiogram Visceral Selective  01/25/2016  INDICATION: 82 year old male with a history of colon cancer metastatic to the liver with progression on last line chemotherapy. He presents today for trans arterial radio embolization of the right hepatic lobe. EXAM: IR EMBO TUMOR ORGAN ISCHEMIA INFARCT INC GUIDE ROADMAPPING; ADDITIONAL ARTERIOGRAPHY; IR ULTRASOUND GUIDANCE VASC ACCESS LEFT; SELECTIVE VISCERAL ARTERIOGRAPHY MEDICATIONS: Zosyn 3.375 g. The antibiotic was administered within 1 hour of the procedure. Additionally, the patient received 10 mg Decadron, 40 mg Protonix, 2.5 mg verapamil, 400 mcg nitroglycerin and 3000 units of heparin. ANESTHESIA/SEDATION: Moderate (conscious) sedation was employed during this procedure. A total of Versed 0.5 mg and Fentanyl 25 mcg was administered intravenously. Moderate Sedation Time: 52 minutes. The patient's level of consciousness and vital signs were monitored continuously by  radiology nursing throughout the procedure under my direct supervision. CONTRAST:  100 mL FLUOROSCOPY TIME:  Fluoroscopy Time: 7 minutes 0 seconds (931 mGy). COMPLICATIONS: None immediate. Estimated blood loss: None PROCEDURE: Informed consent was obtained from the patient following explanation of the procedure, risks, benefits and alternatives. The patient understands, agrees and consents for the procedure. All questions were  addressed. A time out was performed prior to the initiation of the procedure. Maximal barrier sterile technique utilized including caps, mask, sterile gowns, sterile gloves, large sterile drape, hand hygiene, and chlorhexidine prep. The left wrist was interrogated with ultrasound. The radial artery is widely patent and compressible. Local anesthesia was attained by infiltration with 1% lidocaine. Under real-time sonographic guidance, the radial artery was punctured with a 21 gauge micropuncture needle. A 0.018 wire was advanced the radial artery in the micropuncture needle exchanged for a Terumo 5 French slender glide sheath. A 120 cm 4 French glide catheter was then advanced over a Bentson wire into the abdominal aorta. The Bentson wire was exchanged for a Glidewire and the celiac artery was selected. The catheter was advanced into the common hepatic artery. A common hepatic arteriogram was performed confirming the arterial anatomy. There is successful embolization of the gastroduodenal artery. The 4 French glide catheter was then advanced into the proper hepatic artery. An arteriogram was again performed. There is a prominent cystic artery arising from the proper hepatic artery. The right and left arterial anatomy was successfully identified. A Renegade high flow micro catheter was then advanced into the right hepatic artery. Catheter position was confirmed by digital subtraction arteriography. Trans arterial radio embolization was then performed per protocol by injection of small aliquots of resin Y 90 labeled micro spheres. Antegrade flow was confirmed several times throughout the course of the procedure. Administration of the radioactive beads was accomplished without difficulty. There was persistent antegrade flow within the right hepatic artery at the end of the case. The micro catheter and 4 French catheter were removed. Hemostasis was attained with the assistance of a TR band. IMPRESSION: Successful right  lobar trans arterial radio embolization for chemorefractive metastatic colon cancer. PLAN: Clinic visit with labs in 2 weeks. Plan for left lobar trans arterial radio embolization in 4 weeks. Signed, Criselda Peaches, MD [facility] Radiology Electronically Signed   By: Jacqulynn Cadet M.D.   On: 01/25/2016 17:20   Ir Angiogram Selective Each Additional Vessel  01/25/2016  INDICATION: 82 year old male with a history of colon cancer metastatic to the liver with progression on last line chemotherapy. He presents today for trans arterial radio embolization of the right hepatic lobe. EXAM: IR EMBO TUMOR ORGAN ISCHEMIA INFARCT INC GUIDE ROADMAPPING; ADDITIONAL ARTERIOGRAPHY; IR ULTRASOUND GUIDANCE VASC ACCESS LEFT; SELECTIVE VISCERAL ARTERIOGRAPHY MEDICATIONS: Zosyn 3.375 g. The antibiotic was administered within 1 hour of the procedure. Additionally, the patient received 10 mg Decadron, 40 mg Protonix, 2.5 mg verapamil, 400 mcg nitroglycerin and 3000 units of heparin. ANESTHESIA/SEDATION: Moderate (conscious) sedation was employed during this procedure. A total of Versed 0.5 mg and Fentanyl 25 mcg was administered intravenously. Moderate Sedation Time: 52 minutes. The patient's level of consciousness and vital signs were monitored continuously by radiology nursing throughout the procedure under my direct supervision. CONTRAST:  100 mL FLUOROSCOPY TIME:  Fluoroscopy Time: 7 minutes 0 seconds (931 mGy). COMPLICATIONS: None immediate. Estimated blood loss: None PROCEDURE: Informed consent was obtained from the patient following explanation of the procedure, risks, benefits and alternatives. The patient understands, agrees  and consents for the procedure. All questions were addressed. A time out was performed prior to the initiation of the procedure. Maximal barrier sterile technique utilized including caps, mask, sterile gowns, sterile gloves, large sterile drape,  hand hygiene, and chlorhexidine prep. The left wrist was interrogated with ultrasound. The radial artery is widely patent and compressible. Local anesthesia was attained by infiltration with 1% lidocaine. Under real-time sonographic guidance, the radial artery was punctured with a 21 gauge micropuncture needle. A 0.018 wire was advanced the radial artery in the micropuncture needle exchanged for a Terumo 5 French slender glide sheath. A 120 cm 4 French glide catheter was then advanced over a Bentson wire into the abdominal aorta. The Bentson wire was exchanged for a Glidewire and the celiac artery was selected. The catheter was advanced into the common hepatic artery. A common hepatic arteriogram was performed confirming the arterial anatomy. There is successful embolization of the gastroduodenal artery. The 4 French glide catheter was then advanced into the proper hepatic artery. An arteriogram was again performed. There is a prominent cystic artery arising from the proper hepatic artery. The right and left arterial anatomy was successfully identified. A Renegade high flow micro catheter was then advanced into the right hepatic artery. Catheter position was confirmed by digital subtraction arteriography. Trans arterial radio embolization was then performed per protocol by injection of small aliquots of resin Y 90 labeled micro spheres. Antegrade flow was confirmed several times throughout the course of the procedure. Administration of the radioactive beads was accomplished without difficulty. There was persistent antegrade flow within the right hepatic artery at the end of the case. The micro catheter and 4 French catheter were removed. Hemostasis was attained with the assistance of a TR band. IMPRESSION: Successful right lobar trans arterial radio embolization for chemorefractive metastatic colon cancer. PLAN: Clinic visit with labs in 2 weeks. Plan for left lobar trans arterial radio embolization in 4 weeks.  Signed, Criselda Peaches, MD [facility] Radiology Electronically Signed   By: Jacqulynn Cadet M.D.   On: 01/25/2016 17:20   Nm Special Med Rad Physics Cons  01/25/2016  CLINICAL DATA:  Metastatic colon cancer. First treatment to right lobe. EXAM: NUCLEAR MEDICINE SPECIAL MED RAD PHYSICS CONS; NUCLEAR MEDICINE RADIO PHARM THERAPY INTRA ARTERIAL; NUCLEAR MEDICINE TREATMENT PROCEDURE; NUCLEAR MEDICINE LIVER SCAN TECHNIQUE: In conjunction with the interventional radiologist a Y- Microsphere dose was calculated utilizing body surface area formulation. Calculated dose equal 34.68 mCi. Pre therapy MAA liver SPECT scan and CTA were evaluated. Utilizing a microcatheter system, the hepatic artery was selected and Y-90 microspheres were delivered in fractionated aliquots. Radiopharmaceutical was delivered by the interventional radiologist and nuclear radiologist. The patient tolerated procedure well. No adverse effects were noted. Bremsstrahlung planar and SPECT imaging of the abdomen following intrahepatic arterial delivery of Y-90 microsphere was performed. RADIOPHARMACEUTICALS:  34.68 MCI Y90 MICROSPHERES COMPARISON:  04/07/15. FINDINGS: Y - 90 microspheres therapy as above. First therapy the right hepatic lobe. Bremsstrahlung planar and SPECT imaging of the abdomen following intrahepatic arterial delivery of Y-32microsphere demonstrates radioactivity localized to the right hepatic lobe. No evidence of extrahepatic activity. IMPRESSION: Successful Y - 90 microsphere delivery for treatment of unresectable liver metastasis. First therapy to the right lobe. Bremssstrahlung scan demonstrates activity localized to right hepatic lobe with no extrahepatic activity identified. Electronically Signed   By: Kerby Moors M.D.   On: 01/25/2016 14:58   Nm Special Treatment Procedure  01/25/2016  CLINICAL DATA:  Metastatic  colon cancer. First treatment to right lobe. EXAM:  NUCLEAR MEDICINE SPECIAL MED RAD PHYSICS CONS; NUCLEAR MEDICINE RADIO PHARM THERAPY INTRA ARTERIAL; NUCLEAR MEDICINE TREATMENT PROCEDURE; NUCLEAR MEDICINE LIVER SCAN TECHNIQUE: In conjunction with the interventional radiologist a Y- Microsphere dose was calculated utilizing body surface area formulation. Calculated dose equal 34.68 mCi. Pre therapy MAA liver SPECT scan and CTA were evaluated. Utilizing a microcatheter system, the hepatic artery was selected and Y-90 microspheres were delivered in fractionated aliquots. Radiopharmaceutical was delivered by the interventional radiologist and nuclear radiologist. The patient tolerated procedure well. No adverse effects were noted. Bremsstrahlung planar and SPECT imaging of the abdomen following intrahepatic arterial delivery of Y-90 microsphere was performed. RADIOPHARMACEUTICALS:  34.68 MCI Y90 MICROSPHERES COMPARISON:  04/07/15. FINDINGS: Y - 90 microspheres therapy as above. First therapy the right hepatic lobe. Bremsstrahlung planar and SPECT imaging of the abdomen following intrahepatic arterial delivery of Y-31microsphere demonstrates radioactivity localized to the right hepatic lobe. No evidence of extrahepatic activity. IMPRESSION: Successful Y - 90 microsphere delivery for treatment of unresectable liver metastasis. First therapy to the right lobe. Bremssstrahlung scan demonstrates activity localized to right hepatic lobe with no extrahepatic activity identified. Electronically Signed   By: Kerby Moors M.D.   On: 01/25/2016 14:58   Ir US Guide Vasc Access Left  01/25/2016  INDICATION: 82 year old male with a history of colon cancer metastatic to the liver with progression on last line chemotherapy. He presents today for trans arterial radio embolization of the right hepatic lobe. EXAM: IR EMBO TUMOR ORGAN ISCHEMIA INFARCT INC GUIDE ROADMAPPING; ADDITIONAL ARTERIOGRAPHY; IR ULTRASOUND GUIDANCE VASC ACCESS LEFT; SELECTIVE VISCERAL ARTERIOGRAPHY MEDICATIONS:  Zosyn 3.375 g. The antibiotic was administered within 1 hour of the procedure. Additionally, the patient received 10 mg Decadron, 40 mg Protonix, 2.5 mg verapamil, 400 mcg nitroglycerin and 3000 units of heparin. ANESTHESIA/SEDATION: Moderate (conscious) sedation was employed during this procedure. A total of Versed 0.5 mg and Fentanyl 25 mcg was administered intravenously. Moderate Sedation Time: 52 minutes. The patient's level of consciousness and vital signs were monitored continuously by radiology nursing throughout the procedure under my direct supervision. CONTRAST:  100 mL FLUOROSCOPY TIME:  Fluoroscopy Time: 7 minutes 0 seconds (931 mGy). COMPLICATIONS: None immediate. Estimated blood loss: None PROCEDURE: Informed consent was obtained from the patient following explanation of the procedure, risks, benefits and alternatives. The patient understands, agrees and consents for the procedure. All questions were addressed. A time out was performed prior to the initiation of the procedure. Maximal barrier sterile technique utilized including caps, mask, sterile gowns, sterile gloves, large sterile drape, hand hygiene, and chlorhexidine prep. The left wrist was interrogated with ultrasound. The radial artery is widely patent and compressible. Local anesthesia was attained by infiltration with 1% lidocaine. Under real-time sonographic guidance, the radial artery was punctured with a 21 gauge micropuncture needle. A 0.018 wire was advanced the radial artery in the micropuncture needle exchanged for a Terumo 5 French slender glide sheath. A 120 cm 4 French glide catheter was then advanced over a Bentson wire into the abdominal aorta. The Bentson wire was exchanged for a Glidewire and the celiac artery was selected. The catheter was advanced into the common hepatic artery. A common hepatic arteriogram was performed confirming the arterial anatomy. There is successful embolization of the gastroduodenal artery. The 4  French glide catheter was then advanced into the proper hepatic artery. An arteriogram was again performed. There is a prominent cystic artery arising from the proper hepatic artery. The right and  left arterial anatomy was successfully identified. A Renegade high flow micro catheter was then advanced into the right hepatic artery. Catheter position was confirmed by digital subtraction arteriography. Trans arterial radio embolization was then performed per protocol by injection of small aliquots of resin Y 90 labeled micro spheres. Antegrade flow was confirmed several times throughout the course of the procedure. Administration of the radioactive beads was accomplished without difficulty. There was persistent antegrade flow within the right hepatic artery at the end of the case. The micro catheter and 4 French catheter were removed. Hemostasis was attained with the assistance of a TR band. IMPRESSION: Successful right lobar trans arterial radio embolization for chemorefractive metastatic colon cancer. PLAN: Clinic visit with labs in 2 weeks. Plan for left lobar trans arterial radio embolization in 4 weeks. Signed, Criselda Peaches, MD [facility] Radiology Electronically Signed   By: Jacqulynn Cadet M.D.   On: 01/25/2016 17:20   Ir Embo Tumor Organ Ischemia Infarct Inc Guide Roadmapping  01/25/2016  INDICATION: 82 year old male with a history of colon cancer metastatic to the liver with progression on last line chemotherapy. He presents today for trans arterial radio embolization of the right hepatic lobe. EXAM: IR EMBO TUMOR ORGAN ISCHEMIA INFARCT INC GUIDE ROADMAPPING; ADDITIONAL ARTERIOGRAPHY; IR ULTRASOUND GUIDANCE VASC ACCESS LEFT; SELECTIVE VISCERAL ARTERIOGRAPHY MEDICATIONS: Zosyn 3.375 g. The antibiotic was administered within 1 hour of the procedure. Additionally, the patient received 10 mg Decadron, 40 mg Protonix, 2.5 mg verapamil, 400 mcg  nitroglycerin and 3000 units of heparin. ANESTHESIA/SEDATION: Moderate (conscious) sedation was employed during this procedure. A total of Versed 0.5 mg and Fentanyl 25 mcg was administered intravenously. Moderate Sedation Time: 52 minutes. The patient's level of consciousness and vital signs were monitored continuously by radiology nursing throughout the procedure under my direct supervision. CONTRAST:  100 mL FLUOROSCOPY TIME:  Fluoroscopy Time: 7 minutes 0 seconds (931 mGy). COMPLICATIONS: None immediate. Estimated blood loss: None PROCEDURE: Informed consent was obtained from the patient following explanation of the procedure, risks, benefits and alternatives. The patient understands, agrees and consents for the procedure. All questions were addressed. A time out was performed prior to the initiation of the procedure. Maximal barrier sterile technique utilized including caps, mask, sterile gowns, sterile gloves, large sterile drape, hand hygiene, and chlorhexidine prep. The left wrist was interrogated with ultrasound. The radial artery is widely patent and compressible. Local anesthesia was attained by infiltration with 1% lidocaine. Under real-time sonographic guidance, the radial artery was punctured with a 21 gauge micropuncture needle. A 0.018 wire was advanced the radial artery in the micropuncture needle exchanged for a Terumo 5 French slender glide sheath. A 120 cm 4 French glide catheter was then advanced over a Bentson wire into the abdominal aorta. The Bentson wire was exchanged for a Glidewire and the celiac artery was selected. The catheter was advanced into the common hepatic artery. A common hepatic arteriogram was performed confirming the arterial anatomy. There is successful embolization of the gastroduodenal artery. The 4 French glide catheter was then advanced into the proper hepatic artery. An arteriogram was again performed. There is a prominent cystic artery arising from the proper hepatic  artery. The right and left arterial anatomy was successfully identified. A Renegade high flow micro catheter was then advanced into the right hepatic artery. Catheter position was confirmed by digital subtraction arteriography. Trans arterial radio embolization was then performed per protocol by injection of small aliquots of resin Y 90 labeled micro spheres.  Antegrade flow was confirmed several times throughout the course of the procedure. Administration of the radioactive beads was accomplished without difficulty. There was persistent antegrade flow within the right hepatic artery at the end of the case. The micro catheter and 4 French catheter were removed. Hemostasis was attained with the assistance of a TR band. IMPRESSION: Successful right lobar trans arterial radio embolization for chemorefractive metastatic colon cancer. PLAN: Clinic visit with labs in 2 weeks. Plan for left lobar trans arterial radio embolization in 4 weeks. Signed, Criselda Peaches, MD [facility] Electronically Signed   By: Jacqulynn Cadet M.D.   On: 01/25/2016 17:20   Nm Radio Pharm Therapy Intraarterial  01/25/2016  CLINICAL DATA:  Metastatic colon cancer. First treatment to right lobe. EXAM: NUCLEAR MEDICINE SPECIAL MED RAD PHYSICS CONS; NUCLEAR MEDICINE RADIO PHARM THERAPY INTRA ARTERIAL; NUCLEAR MEDICINE TREATMENT PROCEDURE; NUCLEAR MEDICINE LIVER SCAN TECHNIQUE: In conjunction with the interventional radiologist a Y- Microsphere dose was calculated utilizing body surface area formulation. Calculated dose equal 34.68 mCi. Pre therapy MAA liver SPECT scan and CTA were evaluated. Utilizing a microcatheter system, the hepatic artery was selected and Y-90 microspheres were delivered in fractionated aliquots. Radiopharmaceutical was delivered by the interventional radiologist and nuclear radiologist. The patient tolerated procedure well. No adverse effects were noted.  Bremsstrahlung planar and SPECT imaging of the abdomen following intrahepatic arterial delivery of Y-90 microsphere was performed. RADIOPHARMACEUTICALS:  34.68 MCI Y90 MICROSPHERES COMPARISON:  04/07/15. FINDINGS: Y - 90 microspheres therapy as above. First therapy the right hepatic lobe. Bremsstrahlung planar and SPECT imaging of the abdomen following intrahepatic arterial delivery of Y-16microsphere demonstrates radioactivity localized to the right hepatic lobe. No evidence of extrahepatic activity. IMPRESSION: Successful Y - 90 microsphere delivery for treatment of unresectable liver metastasis. First therapy to the right lobe. Bremssstrahlung scan demonstrates activity localized to right hepatic lobe with no extrahepatic activity identified. Electronically Signed   By: Kerby Moors M.D.   On: 01/25/2016 14:58    Labs:  CBC:  Recent Labs  01/05/16 0740 01/25/16 0900 02/09/16 1214  WBC 9.8 9.0 9.4  HGB 10.3* 8.9* 9.1*  HCT 33.5* 28.5* 30.0*  PLT 224 222 273    COAGS:  Recent Labs  01/05/16 0740 01/25/16 0900 02/09/16 1214  INR 1.17 1.38 1.25    BMP:  Recent Labs  01/05/16 0740 01/25/16 0900 02/09/16 1214  NA 140 138 139  K 4.3 4.0 4.7  CL 106 105 103  CO2 23 22 25   GLUCOSE 112* 88 75  BUN 22* 21* 15  CALCIUM 9.1 8.4* 8.3*  CREATININE 1.46* 1.29* 1.20*  GFRNONAA 43* 50* 56*  GFRAA 50* 58* 65    LIVER FUNCTION TESTS:  Recent Labs  01/05/16 0740 01/25/16 0900 02/09/16 1214  BILITOT 1.0 1.3* 1.3*  AST 47* 62* 31  ALT 37 55 23  ALKPHOS 225* 255* 240*  PROT 7.7 6.6 5.7*  ALBUMIN 2.9* 2.4* 2.5*    TUMOR MARKERS:  Recent Labs  01/05/16 0740 02/09/16 1214  CEA 480.6* 360.4*    Assessment and Plan:  1 month status post right hepatic Y 90 radio embolization for chemotherapy refractive metastatic colon cancer. Procedure performed 01/25/2016. He returns for outpatient evaluation for left posterior rib and flank pain. He reports a recent minor fall in the  bathroom and trauma to the area. Chest x-ray and left rib views are negative for fracture, significant effusion or pneumothorax.  Assessment: Mild nonspecific left posterior rib and  flank region musculoskeletal pain, suspect related to recent mild trauma. Otherwise he is doing very well. I see no reason why he cannot proceed with additional Y 90 radio embolization in 2 days.  Plan: Second Y 90 radio embolization is scheduled for Thursday, 02/18/2015.   Electronically Signed: Greggory Keen 02/16/2016, 1:12 PM   I spent a total of    25 Minutes in face to face in clinical consultation, greater than 50% of which was counseling/coordinating care for This patient with metastatic colon cancer, status post right hepatic Y 90 embolization.

## 2016-02-17 ENCOUNTER — Other Ambulatory Visit: Payer: Self-pay | Admitting: General Surgery

## 2016-02-17 ENCOUNTER — Other Ambulatory Visit: Payer: Self-pay | Admitting: Radiology

## 2016-02-18 ENCOUNTER — Other Ambulatory Visit: Payer: Self-pay | Admitting: Interventional Radiology

## 2016-02-18 ENCOUNTER — Ambulatory Visit (HOSPITAL_COMMUNITY)
Admission: RE | Admit: 2016-02-18 | Discharge: 2016-02-18 | Disposition: A | Discharge status: Home / Self Care | Payer: Medicare Other | Source: Ambulatory Visit | Attending: Interventional Radiology | Admitting: Interventional Radiology

## 2016-02-18 ENCOUNTER — Encounter (HOSPITAL_COMMUNITY): Payer: Self-pay

## 2016-02-18 ENCOUNTER — Encounter (HOSPITAL_COMMUNITY)
Admission: RE | Admit: 2016-02-18 | Discharge: 2016-02-18 | Disposition: A | Discharge status: Home / Self Care | Payer: Medicare Other | Source: Ambulatory Visit | Attending: Interventional Radiology | Admitting: Interventional Radiology

## 2016-02-18 DIAGNOSIS — C787 Secondary malignant neoplasm of liver and intrahepatic bile duct: Principal | ICD-10-CM

## 2016-02-18 DIAGNOSIS — Z7901 Long term (current) use of anticoagulants: Secondary | ICD-10-CM | POA: Diagnosis not present

## 2016-02-18 DIAGNOSIS — C189 Malignant neoplasm of colon, unspecified: Secondary | ICD-10-CM

## 2016-02-18 DIAGNOSIS — R918 Other nonspecific abnormal finding of lung field: Secondary | ICD-10-CM | POA: Insufficient documentation

## 2016-02-18 DIAGNOSIS — C19 Malignant neoplasm of rectosigmoid junction: Secondary | ICD-10-CM | POA: Insufficient documentation

## 2016-02-18 LAB — COMPREHENSIVE METABOLIC PANEL
ALT: 31 U/L (ref 17–63)
AST: 51 U/L — AB (ref 15–41)
Albumin: 2.3 g/dL — ABNORMAL LOW (ref 3.5–5.0)
Alkaline Phosphatase: 233 U/L — ABNORMAL HIGH (ref 38–126)
Anion gap: 11 (ref 5–15)
BUN: 20 mg/dL (ref 6–20)
CHLORIDE: 105 mmol/L (ref 101–111)
CO2: 25 mmol/L (ref 22–32)
Calcium: 8.5 mg/dL — ABNORMAL LOW (ref 8.9–10.3)
Creatinine, Ser: 1.49 mg/dL — ABNORMAL HIGH (ref 0.61–1.24)
GFR, EST AFRICAN AMERICAN: 49 mL/min — AB (ref >60)
GFR, EST NON AFRICAN AMERICAN: 42 mL/min — AB (ref >60)
Glucose, Bld: 122 mg/dL — ABNORMAL HIGH (ref 65–99)
POTASSIUM: 4.2 mmol/L (ref 3.5–5.1)
SODIUM: 141 mmol/L (ref 135–145)
Total Bilirubin: 1.2 mg/dL (ref 0.3–1.2)
Total Protein: 6.6 g/dL (ref 6.5–8.1)

## 2016-02-18 LAB — CBC WITH DIFFERENTIAL/PLATELET
BASOS ABS: 0 10*3/uL (ref 0.0–0.1)
Basophils Relative: 0 %
Eosinophils Absolute: 0.1 10*3/uL (ref 0.0–0.7)
Eosinophils Relative: 1 %
HCT: 27.6 % — ABNORMAL LOW (ref 39.0–52.0)
HEMOGLOBIN: 8.5 g/dL — AB (ref 13.0–17.0)
LYMPHS ABS: 0.5 10*3/uL — AB (ref 0.7–4.0)
LYMPHS PCT: 5 %
MCH: 26.6 pg (ref 26.0–34.0)
MCHC: 30.8 g/dL (ref 30.0–36.0)
MCV: 86.3 fL (ref 78.0–100.0)
Monocytes Absolute: 0.9 10*3/uL (ref 0.1–1.0)
Monocytes Relative: 10 %
NEUTROS PCT: 84 %
Neutro Abs: 7.5 10*3/uL (ref 1.7–7.7)
PLATELETS: 159 10*3/uL (ref 150–400)
RBC: 3.2 MIL/uL — AB (ref 4.22–5.81)
RDW: 18.4 % — ABNORMAL HIGH (ref 11.5–15.5)
WBC: 9 10*3/uL (ref 4.0–10.5)

## 2016-02-18 LAB — PROTIME-INR
INR: 1.49 (ref 0.00–1.49)
PROTHROMBIN TIME: 17.6 s — AB (ref 11.6–15.2)

## 2016-02-18 MED ORDER — PIPERACILLIN-TAZOBACTAM 3.375 G IVPB
3.3750 g | Freq: Once | INTRAVENOUS | Status: AC
  Administered 2016-02-18: 3.375 g via INTRAVENOUS
  Filled 2016-02-18: qty 50

## 2016-02-18 MED ORDER — ONDANSETRON HCL 4 MG/2ML IJ SOLN
INTRAMUSCULAR | Status: AC
  Administered 2016-02-18: 4 mg via INTRAVENOUS
  Filled 2016-02-18: qty 2

## 2016-02-18 MED ORDER — ONDANSETRON HCL 4 MG/2ML IJ SOLN
4.0000 mg | Freq: Once | INTRAMUSCULAR | Status: AC
  Administered 2016-02-18: 4 mg via INTRAVENOUS

## 2016-02-18 MED ORDER — VERAPAMIL HCL 2.5 MG/ML IV SOLN
INTRAVENOUS | Status: AC | PRN
  Administered 2016-02-18: 2.5 mg via INTRA_ARTERIAL

## 2016-02-18 MED ORDER — FENTANYL CITRATE (PF) 100 MCG/2ML IJ SOLN
INTRAMUSCULAR | Status: AC
  Filled 2016-02-18: qty 4

## 2016-02-18 MED ORDER — LIDOCAINE HCL 1 % IJ SOLN
INTRAMUSCULAR | Status: AC | PRN
  Administered 2016-02-18: 5 mL via INTRADERMAL

## 2016-02-18 MED ORDER — HEPARIN SODIUM (PORCINE) 1000 UNIT/ML IJ SOLN
INTRAMUSCULAR | Status: AC | PRN
  Administered 2016-02-18: 3000 [IU] via INTRA_ARTERIAL

## 2016-02-18 MED ORDER — HEPARIN SOD (PORK) LOCK FLUSH 100 UNIT/ML IV SOLN
500.0000 [IU] | INTRAVENOUS | Status: AC | PRN
  Administered 2016-02-18: 500 [IU]
  Filled 2016-02-18: qty 5

## 2016-02-18 MED ORDER — IOPAMIDOL (ISOVUE-300) INJECTION 61%
100.0000 mL | Freq: Once | INTRAVENOUS | Status: AC | PRN
  Administered 2016-02-18: 25 mL via INTRA_ARTERIAL

## 2016-02-18 MED ORDER — NITROGLYCERIN 1 MG/10 ML FOR IR/CATH LAB
INTRA_ARTERIAL | Status: AC | PRN
  Administered 2016-02-18 (×2): 200 ug via INTRA_ARTERIAL

## 2016-02-18 MED ORDER — PANTOPRAZOLE SODIUM 40 MG IV SOLR
INTRAVENOUS | Status: AC
  Administered 2016-02-18: 40 mg via INTRAVENOUS
  Filled 2016-02-18: qty 40

## 2016-02-18 MED ORDER — SODIUM CHLORIDE 0.9% FLUSH: 3.0000 mL | INTRAVENOUS | Status: DC | PRN

## 2016-02-18 MED ORDER — PANTOPRAZOLE SODIUM 40 MG IV SOLR
40.0000 mg | Freq: Once | INTRAVENOUS | Status: AC
  Administered 2016-02-18: 40 mg via INTRAVENOUS

## 2016-02-18 MED ORDER — SODIUM CHLORIDE 0.9 % IV SOLN
INTRAVENOUS | Status: DC
  Administered 2016-02-18: 07:00:00 via INTRAVENOUS

## 2016-02-18 MED ORDER — HEPARIN SODIUM (PORCINE) 1000 UNIT/ML IJ SOLN
INTRAMUSCULAR | Status: AC
  Filled 2016-02-18: qty 1

## 2016-02-18 MED ORDER — DEXAMETHASONE SODIUM PHOSPHATE 10 MG/ML IJ SOLN
8.0000 mg | Freq: Once | INTRAMUSCULAR | Status: AC
  Administered 2016-02-18: 8 mg via INTRAVENOUS

## 2016-02-18 MED ORDER — SODIUM CHLORIDE 0.9 % IV SOLN
INTRAVENOUS | Status: AC
Start: 2016-02-18 — End: 2016-02-18

## 2016-02-18 MED ORDER — MIDAZOLAM HCL 2 MG/2ML IJ SOLN
INTRAMUSCULAR | Status: AC
  Filled 2016-02-18: qty 6

## 2016-02-18 MED ORDER — ONDANSETRON HCL 4 MG/2ML IJ SOLN: 4.0000 mg | Freq: Four times a day (QID) | INTRAMUSCULAR | Status: DC | PRN

## 2016-02-18 MED ORDER — LIDOCAINE HCL 1 % IJ SOLN
INTRAMUSCULAR | Status: AC
  Filled 2016-02-18: qty 20

## 2016-02-18 MED ORDER — SODIUM CHLORIDE 0.9 % IV SOLN: 250.0000 mL | INTRAVENOUS | Status: DC | PRN

## 2016-02-18 MED ORDER — SODIUM CHLORIDE 0.9% FLUSH: 3.0000 mL | Freq: Two times a day (BID) | INTRAVENOUS | Status: DC

## 2016-02-18 MED ORDER — DEXAMETHASONE SODIUM PHOSPHATE 10 MG/ML IJ SOLN
INTRAMUSCULAR | Status: AC
  Administered 2016-02-18: 8 mg via INTRAVENOUS
  Filled 2016-02-18: qty 1

## 2016-02-18 MED ORDER — VERAPAMIL HCL 2.5 MG/ML IV SOLN
INTRAVENOUS | Status: AC
  Filled 2016-02-18: qty 2

## 2016-02-18 MED ORDER — MIDAZOLAM HCL 2 MG/2ML IJ SOLN
INTRAMUSCULAR | Status: AC | PRN
  Administered 2016-02-18 (×2): 0.5 mg via INTRAVENOUS

## 2016-02-18 MED ORDER — FENTANYL CITRATE (PF) 100 MCG/2ML IJ SOLN
INTRAMUSCULAR | Status: AC | PRN
  Administered 2016-02-18: 25 ug via INTRAVENOUS

## 2016-02-18 NOTE — Progress Notes (Signed)
3cc air drawn from TR band.  No bleeding noted.

## 2016-02-18 NOTE — Sedation Documentation (Signed)
5Fr sheath removed from left radial wrist by Dr. Laurence Ferrari.  Hemostasis achieved using TR Band.

## 2016-02-18 NOTE — Progress Notes (Signed)
Patient ID: Erik Foster, male   DOB: August 30, 1932, 82 y.o.   MRN: [id number]    Referring Physician(s): [place]  Supervising Physician: Jacqulynn Cadet  Patient Status: OP  Chief Complaint:  Metastatic colon cancer to liver  Subjective: Patient familiar to IR service from prior right hepatic lobe Y 90 radio embolization 01/25/16. He has known metastatic colon cancer to the liver which has progressed despite chemotherapy and presents again today to undergo Y 90 radio embolization of the left hepatic lobe tumor. He currently denies fever, headache, chest pain, dyspnea, cough, abdominal/significant back pain, nausea vomiting or abnormal bleeding. He was seen earlier this week in IR clinic for left upper back and flank pain, however this has diminished in intensity since that time. Chest x-ray and rib detail films were negative for acute problems.   Allergies: Review of patient's allergies indicates no known allergies.  Medications: Prior to Admission medications   Medication Sig Start Date End Date Taking? Authorizing Provider  amiodarone (PACERONE) 200 MG tablet Take 200 mg by mouth daily.   Yes Historical Provider, MD  atorvastatin (LIPITOR) 20 MG tablet Take 20 mg by mouth daily.   Yes Historical Provider, MD  feeding supplement (BOOST HIGH PROTEIN) LIQD Take 1 Container by mouth 3 (three) times daily between meals.   Yes Historical Provider, MD  lisinopril (PRINIVIL,ZESTRIL) 2.5 MG tablet Take 2.5 mg by mouth daily.   Yes Historical Provider, MD  Multiple Vitamin (MULTIVITAMIN) tablet Take 1 tablet by mouth daily. Pt has been drinking boost instead   Yes Historical Provider, MD  oxyCODONE-acetaminophen (PERCOCET) 10-650 MG tablet Take 1 tablet by mouth every 6 (six) hours as needed for pain.   Yes Historical Provider, MD  acetaminophen (TYLENOL) 325 MG tablet Take 650 mg by mouth every 6 (six) hours as needed.    Historical Provider, MD  apixaban (ELIQUIS) 2.5 MG TABS tablet  Take 2.5 mg by mouth 2 (two) times daily.    Historical Provider, MD     Vital Signs: Ht 6\' 1"  (1.854 m)  Wt 194 lb (87.998 kg)  BMI 25.60 kg/m2  Physical Exam patient awake, alert. Chest with slightly diminished breath sounds bases, few left basilar crackles. Intact right chest wall Port-A-Cath and left chest wall AICD; Heart with regular rate and rhythm. Abd soft, positive bowel sounds, nontender. Lower extremities with trace edema bilaterally  Imaging: Dg Ribs Unilateral W/chest Left  02/16/2016  CLINICAL DATA:  Recent fall in bathroom, left posterior flank and rib pain. EXAM: LEFT RIBS AND CHEST - 3+ VIEW COMPARISON:  09/14/2013 FINDINGS: Right IJ port catheter tip proximal SVC. Left subclavian 2 lead pacer noted. Normal heart size vascularity. Minor increased bibasilar atelectasis. Bilateral lower lobe pulmonary nodules by CT comparison or difficult to visualize by plain radiography. Increased bibasilar atelectasis versus scarring. Trachea is midline. Small left effusion versus pleural thickening noted. Trachea is midline. No significant pleural effusion or pneumothorax. Degenerative changes of the spine. Left rib views demonstrate no significant displaced rib fracture or focal rib abnormality. Previous gastroduodenal artery coil embolization noted. IMPRESSION: No evidence of acute displaced left rib fracture, significant left effusion or pneumothorax. Increased bibasilar atelectasis versus scarring. Electronically Signed   By: Jerilynn Mages.  Shick M.D.   On: 02/16/2016 13:02    Labs:  CBC:  Recent Labs  01/05/16 0740 01/25/16 0900 02/09/16 1214 02/18/16 0715  WBC 9.8 9.0 9.4 9.0  HGB 10.3* 8.9* 9.1* 8.5*  HCT 33.5* 28.5* 30.0* 27.6*  PLT 224 222 273 159  COAGS:  Recent Labs  01/05/16 0740 01/25/16 0900 02/09/16 1214 02/18/16 0715  INR 1.17 1.38 1.25 1.49    BMP:  Recent Labs  01/05/16 0740 01/25/16 0900 02/09/16 1214 02/18/16 0715  NA 140 138 139 141  K 4.3 4.0 4.7 4.2   CL 106 105 103 105  CO2 23 22 25 25   GLUCOSE 112* 88 75 122*  BUN 22* 21* 15 20  CALCIUM 9.1 8.4* 8.3* 8.5*  CREATININE 1.46* 1.29* 1.20* 1.49*  GFRNONAA 43* 50* 56* 42*  GFRAA 50* 58* 65 49*    LIVER FUNCTION TESTS:  Recent Labs  01/05/16 0740 01/25/16 0900 02/09/16 1214 02/18/16 0715  BILITOT 1.0 1.3* 1.3* 1.2  AST 47* 62* 31 51*  ALT 37 55 23 31  ALKPHOS 225* 255* 240* 233*  PROT 7.7 6.6 5.7* 6.6  ALBUMIN 2.9* 2.4* 2.5* 2.3*    Assessment and Plan: Pt with known metastatic colon cancer to the liver which has progressed despite chemotherapy, status post right hepatic lobe Y 90 radio embolization on 01/25/16. He presents again today to undergo Y 90 radio embolization of the left hepatic lobe tumor. Details/risks of procedure, including not limited to, internal bleeding, infection, contrast nephropathy, nontarget embolization discussed with patient/wife with their understanding and consent.   Electronically Signed: D. Rowe Robert 02/18/2016, 8:21 AM   I spent a total of 20 minutes at the the patient's bedside AND on the patient's hospital floor or unit, greater than 50% of which was counseling/coordinating care for hepatic Y 90 radio embolization

## 2016-02-18 NOTE — Discharge Instructions (Signed)
Angiogram, Care After °These instructions give you information about caring for yourself after your procedure. Your doctor may also give you more specific instructions. Call your doctor if you have any problems or questions after your procedure.  °HOME CARE °· Take medicines only as told by your doctor. °· Follow your doctor's instructions about: °¨ Care of the area where the tube was inserted. °¨ Bandage (dressing) changes and removal. °· You may shower 24-48 hours after the procedure or as told by your doctor. °· Do not take baths, swim, or use a hot tub until your doctor approves. °· Every day, check the area where the tube was inserted. Watch for: °¨ Redness, swelling, or pain. °¨ Fluid, blood, or pus. °· Do not apply powder or lotion to the site. °· Do not lift anything that is heavier than 10 lb (4.5 kg) for 5 days or as told by your doctor. °· Ask your doctor when you can: °¨ Return to work or school. °¨ Do physical activities or play sports. °¨ Have sex. °· Do not drive or operate heavy machinery for 24 hours or as told by your doctor. °· Have someone with you for the first 24 hours after the procedure. °· Keep all follow-up visits as told by your doctor. This is important. °GET HELP IF: °· You have a fever.   °· You have chills.   °· You have more bleeding from the area where the tube was inserted. Hold pressure on the area. °· You have redness, swelling, or pain in the area where the tube was inserted. °· You have fluid or pus coming from the area. °GET HELP RIGHT AWAY IF:  °· You have a lot of pain in the area where the tube was inserted. °· The area where the tube was inserted is bleeding, and the bleeding does not stop after 30 minutes of holding steady pressure on the area. °· The area near or just beyond the insertion site becomes pale, cool, tingly, or numb. °  °This information is not intended to replace advice given to you by your health care provider. Make sure you discuss any questions you have  with your health care provider. °  °Document Released: 12/30/2008 Document Revised: 10/24/2014 Document Reviewed: 03/06/2013 °Elsevier Interactive Patient Education ©2016 [employer] ° ° °Post Y-90 Radioembolization Discharge Instructions ° °You have been given a radioactive material during your procedure.  While it is safe for you to be discharged home from the hospital, you need to proceed directly home.   ° °Do not use public transportation, including air travel, lasting more than 2 hours for 1 week. ° °Avoid crowded public places for 1 week. ° °Adult visitors should try to avoid close contact with you for 1 week.   ° °Children and pregnant females should not visit or have close contact with you for 1 week. ° °Items that you touch are not radioactive. ° °Do not sleep in the same bed as your partner for 1 week, and a condom should be used for sexual activity during the first 24 hours. ° °Your blood may be radioactive and caution should be used if any bleeding occurs during the recovery period. ° °Body fluids may be radioactive for 24 hours.  Wash your hands after voiding.  Men should sit to urinate.  Dispose of any soiled materials (flush down toilet or place in trash at home) during the first day. ° °Drink 6 to 8 glasses of fluids per day for 5 days to hydrate   yourself. ° °If you need to see a doctor during the first week, you must let them know that you were treated with yttrium-90 microspheres, and will be slightly radioactive.  They can call Interventional Radiology [phone] with any questions. ° °Moderate Conscious Sedation, Adult, Care After °Refer to this sheet in the next few weeks. These instructions provide you with information on caring for yourself after your procedure. Your health care provider may also give you more specific instructions. Your treatment has been planned according to current medical practices, but problems sometimes occur. Call your health care provider if you have any problems or  questions after your procedure. °WHAT TO EXPECT AFTER THE PROCEDURE  °After your procedure: °· You may feel sleepy, clumsy, and have poor balance for several hours. °· Vomiting may occur if you eat too soon after the procedure. °HOME CARE INSTRUCTIONS °· Do not participate in any activities where you could become injured for at least 24 hours. Do not: °· Drive. °· Swim. °· Ride a bicycle. °· Operate heavy machinery. °· Cook. °· Use power tools. °· Climb ladders. °· Work from a high place. °· Do not make important decisions or sign legal documents until you are improved. °· If you vomit, drink water, juice, or soup when you can drink without vomiting. Make sure you have little or no nausea before eating solid foods. °· Only take over-the-counter or prescription medicines for pain, discomfort, or fever as directed by your health care provider. °· Make sure you and your family fully understand everything about the medicines given to you, including what side effects may occur. °· You should not drink alcohol, take sleeping pills, or take medicines that cause drowsiness for at least 24 hours. °· If you smoke, do not smoke without supervision. °· If you are feeling better, you may resume normal activities 24 hours after you were sedated. °· Keep all appointments with your health care provider. °SEEK MEDICAL CARE IF: °· Your skin is pale or bluish in color. °· You continue to feel nauseous or vomit. °· Your pain is getting worse and is not helped by medicine. °· You have bleeding or swelling. °· You are still sleepy or feeling clumsy after 24 hours. °SEEK IMMEDIATE MEDICAL CARE IF: °· You develop a rash. °· You have difficulty breathing. °· You develop any type of allergic problem. °· You have a fever. °MAKE SURE YOU: °· Understand these instructions. °· Will watch your condition. °· Will get help right away if you are not doing well or get worse. °  °This information is not intended to replace advice given to you by your  health care provider. Make sure you discuss any questions you have with your health care provider. °  °Document Released: 07/24/2013 Document Revised: 10/24/2014 Document Reviewed: 07/24/2013 °Elsevier Interactive Patient Education ©2016 Elsevier Inc. ° °

## 2016-02-18 NOTE — Progress Notes (Signed)
3cc air removed from TR band by Rowe Robert, PA  No bleeding noted

## 2016-02-18 NOTE — Progress Notes (Signed)
4cc air removed from TR band. No bleeding noted.

## 2016-02-18 NOTE — Sedation Documentation (Signed)
L radial TR band CDI.

## 2016-02-18 NOTE — Progress Notes (Signed)
TR band removed, deflation complete, no bleeding noted.  Gauze and tegaderm applied to left wrist over puncture site.

## 2016-02-18 NOTE — Procedures (Signed)
Interventional Radiology Procedure Note  Procedure: LEFT lobar Y90 radioembolization.  Vascular Access: Left radial, 51F --> TR Band  Complications: None   Estimated Blood Loss: None  Recommendations: - To Nucs for imaging - TR band take-down per protocol - DC home  Signed,  Criselda Peaches, MD

## 2016-02-18 NOTE — Sedation Documentation (Signed)
250NS bolus started.

## 2016-03-02 ENCOUNTER — Other Ambulatory Visit: Payer: Self-pay | Admitting: Radiology

## 2016-03-02 ENCOUNTER — Other Ambulatory Visit (HOSPITAL_COMMUNITY): Payer: Self-pay | Admitting: Interventional Radiology

## 2016-03-02 DIAGNOSIS — C189 Malignant neoplasm of colon, unspecified: Secondary | ICD-10-CM

## 2016-03-02 DIAGNOSIS — C787 Secondary malignant neoplasm of liver and intrahepatic bile duct: Secondary | ICD-10-CM

## 2016-03-16 LAB — CREATININE WITH EST GFR
Creat: 1.24 mg/dL — ABNORMAL HIGH (ref 0.70–1.11)
GFR, EST NON AFRICAN AMERICAN: 54 mL/min — AB (ref >=60)
GFR, Est African American: 63 mL/min (ref >=60)

## 2016-03-16 LAB — BUN: BUN: 18 mg/dL (ref 7–25)

## 2016-03-17 ENCOUNTER — Ambulatory Visit
Admission: RE | Admit: 2016-03-17 | Discharge: 2016-03-17 | Disposition: A | Discharge status: Home / Self Care | Payer: Medicare Other | Source: Ambulatory Visit | Attending: Interventional Radiology | Admitting: Interventional Radiology

## 2016-03-17 DIAGNOSIS — C189 Malignant neoplasm of colon, unspecified: Secondary | ICD-10-CM

## 2016-03-17 DIAGNOSIS — C787 Secondary malignant neoplasm of liver and intrahepatic bile duct: Principal | ICD-10-CM

## 2016-03-17 NOTE — Progress Notes (Signed)
Patient ID: Erik Foster, male   DOB: 1932-01-26, 82 y.o.   MRN: [id number]   Referring Physician(s): Lewis,Dequincy  Chief Complaint: The patient is seen in follow up today s/p left hepatic lobe  Y 90 radio embolization on 02/18/16  History of present illness: Erik Foster is an 82 year old white male with history of unresectable colon cancer to the liver and prior right hepatic lobe Y 90 radioembolization on 01/25/16. On 02/18/16 he underwent left hepatic lobe Y 90 radioembolization. He presents today for routine outpatient follow-up. He has done fairly well since his last treatment. He did fall at home this past Tuesday and went to [facility] ED for evaluation. He states there were no fractures detected, however he is currently in a LUE sling as a precaution. He was given contact for orthopedic referral if needed. He does have some occasional fatigue as well as some dyspnea with exertion. He also notes some intermittent mild abdominal/back discomfort, but this has not increased since the above procedure. His appetite is fair. He denies any acute bowel or bladder dysfunction, fever, headache, chest pain, cough, nausea, vomiting or abnormal bleeding. Latest creatinine is 1.24, previously 1.49.   Past Medical History  Diagnosis Date  . Dysrhythmia   . AICD (automatic cardioverter/defibrillator) present 2015  . Cancer [facility])     Colon w liver mets    Past Surgical History  Procedure Laterality Date  . Colon surgery  2013  . Biv icd st jude zuadra assura  11/13/2013    . Hernia repair Right     Inguinal hernia repair  . Portacath placement      X 2  . Port-a-cath removal      Allergies: Review of patient's allergies indicates no known allergies.  Medications: Prior to Admission medications   Medication Sig Start Date End Date Taking? Authorizing Provider  acetaminophen (TYLENOL) 325 MG tablet Take 650 mg by mouth every 6 (six) hours as needed.   Yes Historical Provider, MD    amiodarone (PACERONE) 200 MG tablet Take 200 mg by mouth daily.   Yes Historical Provider, MD  apixaban (ELIQUIS) 2.5 MG TABS tablet Take 2.5 mg by mouth 2 (two) times daily.   Yes Historical Provider, MD  atorvastatin (LIPITOR) 20 MG tablet Take 20 mg by mouth daily.   Yes Historical Provider, MD  feeding supplement (BOOST HIGH PROTEIN) LIQD Take 1 Container by mouth 3 (three) times daily between meals.   Yes Historical Provider, MD  lisinopril (PRINIVIL,ZESTRIL) 2.5 MG tablet Take 2.5 mg by mouth daily.   Yes Historical Provider, MD  Multiple Vitamin (MULTIVITAMIN) tablet Take 1 tablet by mouth daily. Pt has been drinking boost instead   Yes Historical Provider, MD  oxyCODONE-acetaminophen (PERCOCET) 10-650 MG tablet Take 1 tablet by mouth every 6 (six) hours as needed for pain.   Yes Historical Provider, MD     No family history on file.  Social History   Social History  . Marital Status: Married    Spouse Name: N/A  . Number of Children: N/A  . Years of Education: N/A   Social History Main Topics  . Smoking status: Former Smoker -- 0.25 packs/day    Types: Cigarettes    Start date: 12/21/1953    Quit date: 12/22/1963  . Smokeless tobacco: Never Used  . Alcohol Use: No  . Drug Use: No  . Sexual Activity: Not on file   Other Topics Concern  . Not on file   Social History Narrative  Vital Signs: BP 107/60 mmHg  Pulse 88  Temp(Src) 98.2 F (36.8 C) (Oral)  Resp 14  Ht 6\' 1"  (1.854 m)  Wt 193 lb (87.544 kg)  BMI 25.47 kg/m2  SpO2 100%  Physical Exam  patient awake, alert. Chest with slightly diminished breath sounds right base, left clear; heart with regular rate and rhythm. Clean, intact right chest wall Port-A-Cath and left chest wall AICD; abdomen soft, mildly distended, nontender; 1+ left lower extremity edema, no right lower extremity edema. Left upper extremity in sling, but motor /sensory function intact; prior left radial artery access site clean, dry, no  hematoma, nontender, 2+ pulse  Imaging: No results found.  Labs:  CBC:  Recent Labs  01/05/16 0740 01/25/16 0900 02/09/16 1214 02/18/16 0715  WBC 9.8 9.0 9.4 9.0  HGB 10.3* 8.9* 9.1* 8.5*  HCT 33.5* 28.5* 30.0* 27.6*  PLT 224 222 273 159    COAGS:  Recent Labs  01/05/16 0740 01/25/16 0900 02/09/16 1214 02/18/16 0715  INR 1.17 1.38 1.25 1.49    BMP:  Recent Labs  01/05/16 0740 01/25/16 0900 02/09/16 1214 02/18/16 0715 03/15/16 1239  NA 140 138 139 141  --   K 4.3 4.0 4.7 4.2  --   CL 106 105 103 105  --   CO2 23 22 25 25   --   GLUCOSE 112* 88 75 122*  --   BUN 22* 21* 15 20 18   CALCIUM 9.1 8.4* 8.3* 8.5*  --   CREATININE 1.46* 1.29* 1.20* 1.49* 1.24*  GFRNONAA 43* 50* 56* 42* 54*  GFRAA 50* 58* 65 49* 63    LIVER FUNCTION TESTS:  Recent Labs  01/05/16 0740 01/25/16 0900 02/09/16 1214 02/18/16 0715  BILITOT 1.0 1.3* 1.3* 1.2  AST 47* 62* 31 51*  ALT 37 55 23 31  ALKPHOS 225* 255* 240* 233*  PROT 7.7 6.6 5.7* 6.6  ALBUMIN 2.9* 2.4* 2.5* 2.3*    Assessment: Patient with history of unresectable colon cancer to the liver ; status post right hepatic lobe Y 90 radio embolization on 01/25/16 followed by left hepatic lobe Y 90 radio embolization on 02/18/16. Patient currently stable; he is scheduled to follow-up with Dr. Bobby Rumpf on 03/22/16. Plan at this time will be follow-up MRI of the abdomen in 2 months.   Signed: D. Rowe Robert 03/17/2016, 12:09 PM   Please refer to Dr. Laurence Ferrari 's attestation of this note for management and plan.

## 2016-04-16 DEATH — deceased

## 2016-07-19 ENCOUNTER — Encounter: Payer: Self-pay | Admitting: Interventional Radiology

## 2017-07-12 IMAGING — NM NM SPECIAL TREATMENT PROCEDURE
3 series · 18 of 18 positions shown · non-contrast
Comparison: 04/07/15.

CLINICAL DATA: Metastatic colon cancer. First treatment to right
lobe.

EXAM:
NUCLEAR MEDICINE SPECIAL MED RAD PHYSICS CONS; NUCLEAR MEDICINE
RADIO PHARM THERAPY INTRA ARTERIAL; NUCLEAR MEDICINE TREATMENT
PROCEDURE; NUCLEAR MEDICINE LIVER SCAN
TECHNIQUE: In conjunction with the interventional radiologist a Y- Microsphere
dose was calculated utilizing body surface area formulation.
Calculated dose equal 34.68 mCi. Pre therapy MAA liver SPECT scan
and CTA were evaluated. Utilizing a microcatheter system, the
hepatic artery was selected and Y-90 microspheres were delivered in
fractionated aliquots. Radiopharmaceutical was delivered by the
interventional radiologist and nuclear radiologist.
The patient tolerated procedure well. No adverse effects were noted.
Bremsstrahlung planar and SPECT imaging of the abdomen following
intrahepatic arterial delivery of Y-90 microsphere was performed.
RADIOPHARMACEUTICALS:  34.68 MCI [AGE] MICROSPHERES

[Series 1: spect - (id) _(id)_cor · 4.1mm · 4.14mm/px · 6 of 128 frames shown]
[frame 11/128]
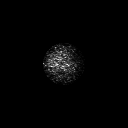
[frame 32/128]
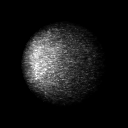
[frame 54/128]
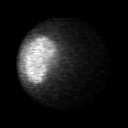
[frame 75/128]
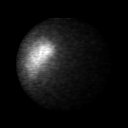
[frame 96/128]
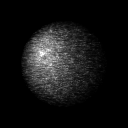
[frame 118/128]
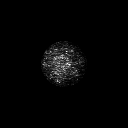

[Series 1: spect - (id) _(id)_tra · 4.1mm · 4.14mm/px · 6 of 128 frames shown]
[frame 11/128]
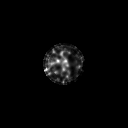
[frame 32/128]
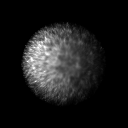
[frame 54/128]
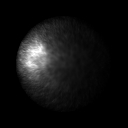
[frame 75/128]
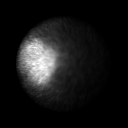
[frame 96/128]
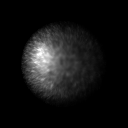
[frame 118/128]
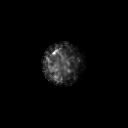

[Series 2: y-90 spect · 4.14mm/px · 6 of 64 frames shown]
[frame 6/64  full-range]
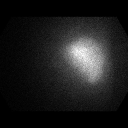
[frame 16/64  full-range]
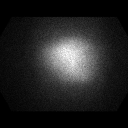
[frame 27/64  full-range]
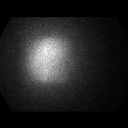
[frame 38/64  full-range]
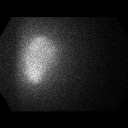
[frame 48/64  full-range]
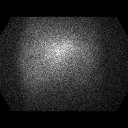
[frame 59/64  full-range]
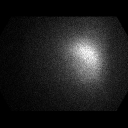

[18 of 18 positions shown; findings below may reference images not displayed]

FINDINGS: [AGE] microspheres therapy as above. First therapy the right
hepatic lobe.

Bremsstrahlung planar and SPECT imaging of the abdomen following
intrahepatic arterial delivery of K-0Qmicrosphere demonstrates
radioactivity localized to the right hepatic lobe. No evidence of
extrahepatic activity.
IMPRESSION: Successful [AGE] microsphere delivery for treatment of unresectable
liver metastasis. First therapy to the right lobe.

Bremssstrahlung scan demonstrates activity localized to right
hepatic lobe with no extrahepatic activity identified.

## 2017-07-12 IMAGING — XA IR ANGIO/VISCERAL SELECTIVE EA VESSEL WO/W FLUSH
9 series · 14 of 24 positions shown · non-contrast
Comparison: none

INDICATION: 83-year-old male with a history of colon cancer metastatic to the
liver with progression on last line chemotherapy. He presents today
for trans arterial radio embolization of the right hepatic lobe.

[Series 1: care single · 1 of 1 slices shown]
[im 1/1]
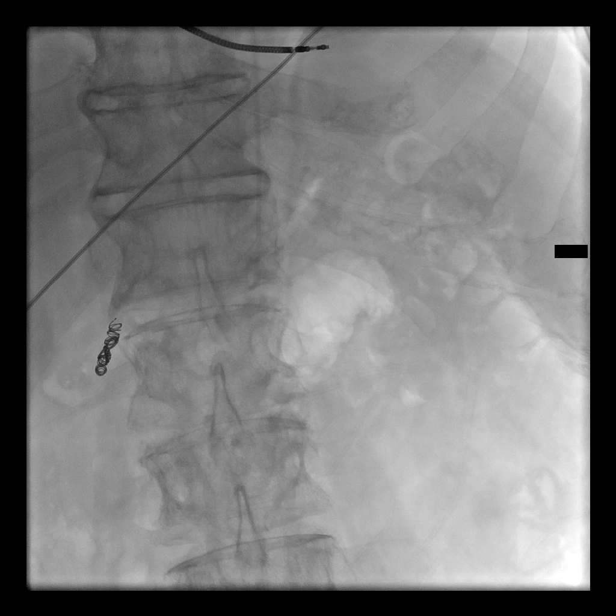

[Series 2: body 4 · 1 of 16 frames shown (1 of 5)]
[frame 9/16]
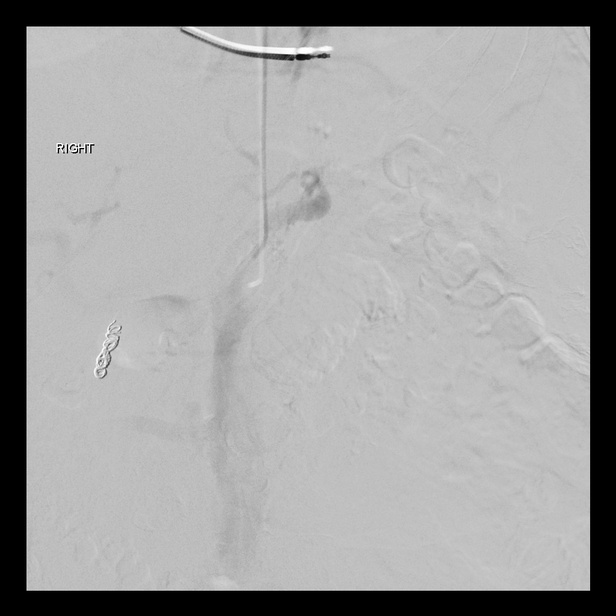

[Series 3: body 4 · 2 of 21 frames shown (2 of 5)]
[frame 11/21]
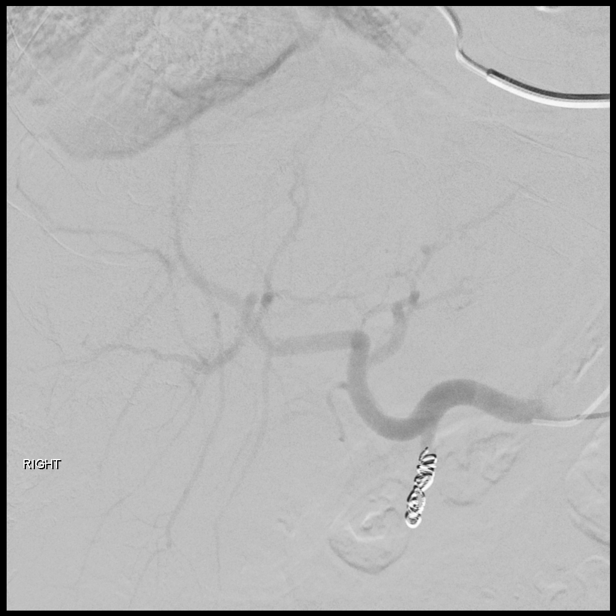
[frame 18/21]
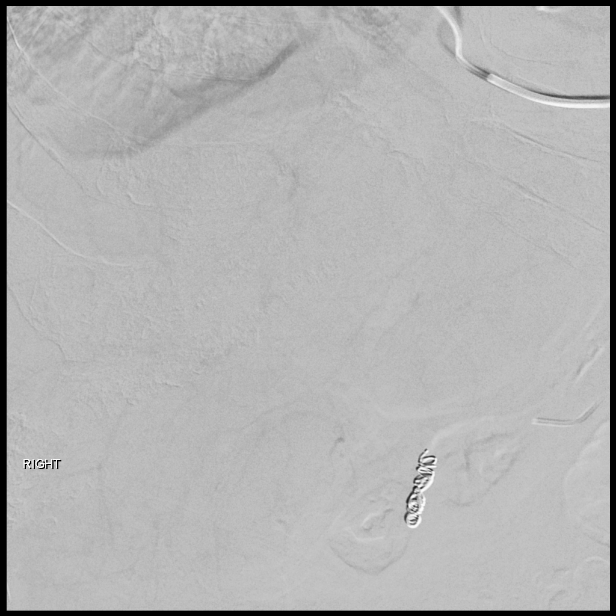

[Series 4: body 4 · 1 of 16 frames shown (3 of 5)]
[frame 3/16]
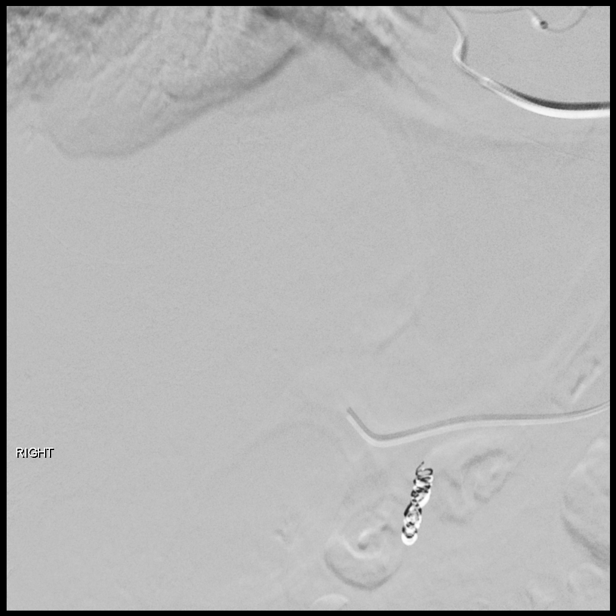

[Series 5: body 4 · 2 of 19 frames shown (4 of 5)]
[frame 1/19]
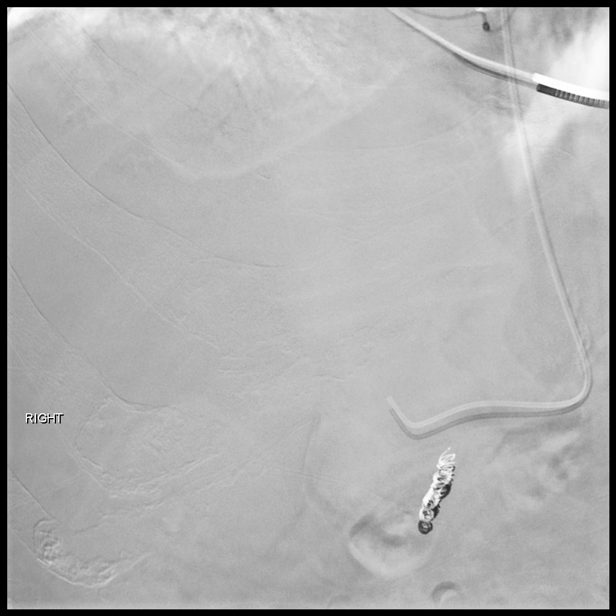
[frame 10/19]
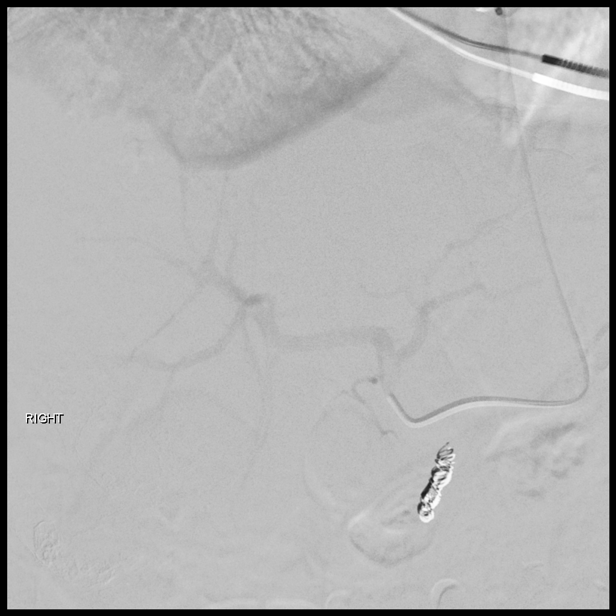

[Series 6: body 4 · 2 of 25 frames shown (5 of 5)]
[frame 4/25]
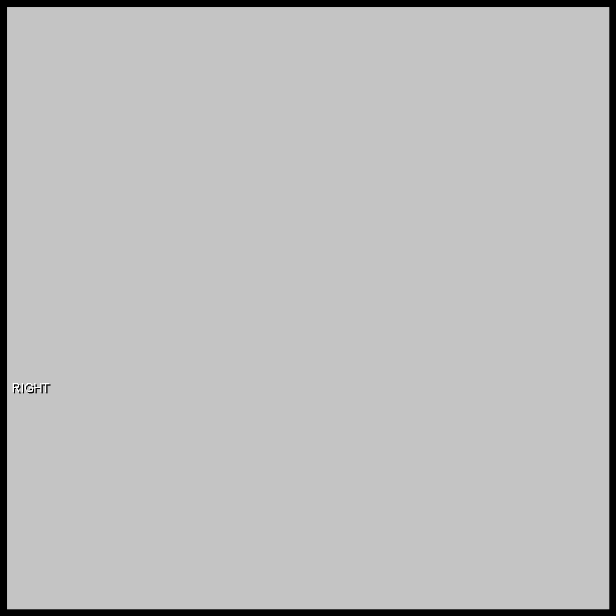
[frame 22/25]
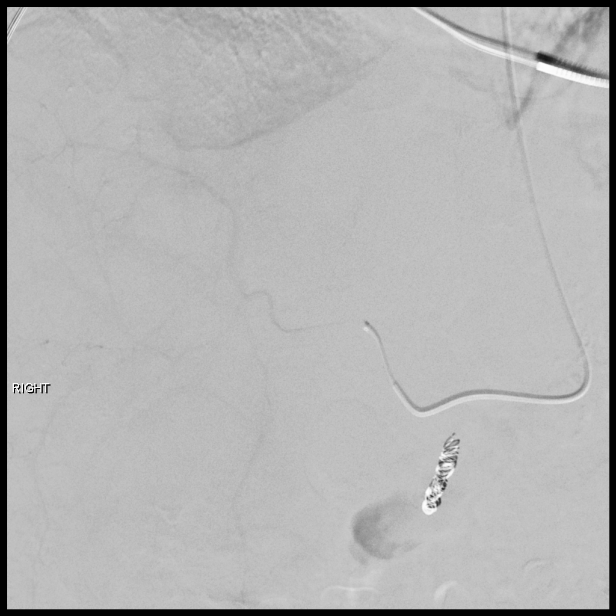

[Series 7: fl - angio · 1 of 111 frames shown (1 of 3)]
[frame 56/111]
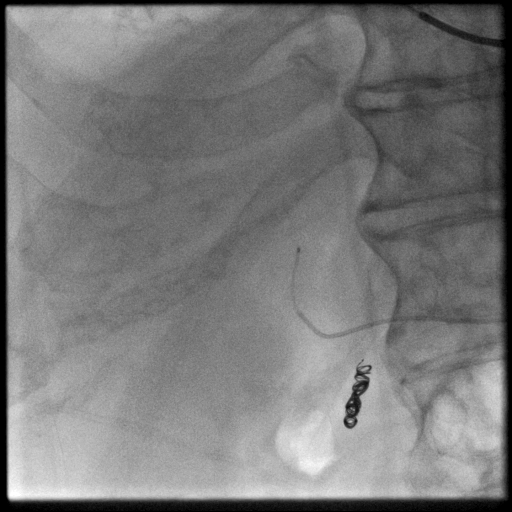

[Series 8: fl - angio · 2 of 110 frames shown (2 of 3)]
[frame 17/110]
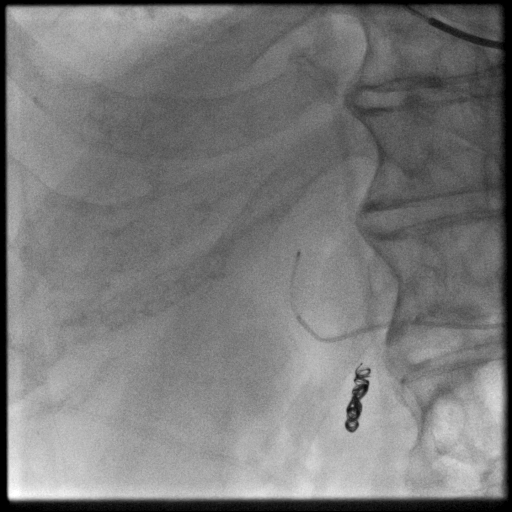
[frame 56/110]
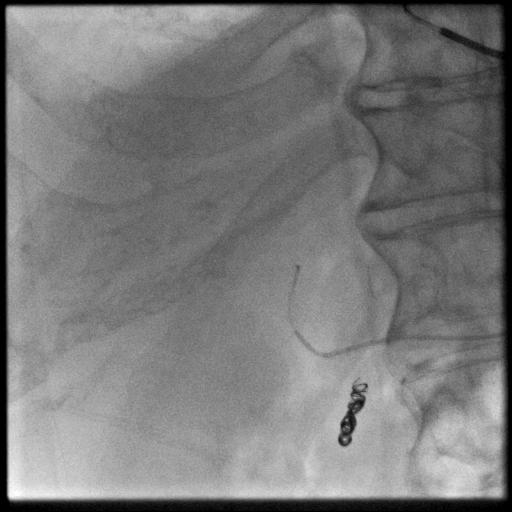

[Series 9: fl - angio · 2 of 79 frames shown (3 of 3)]
[frame 12/79]
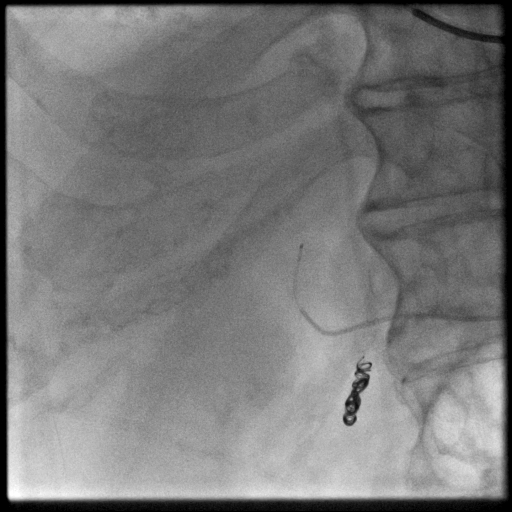
[frame 73/79]
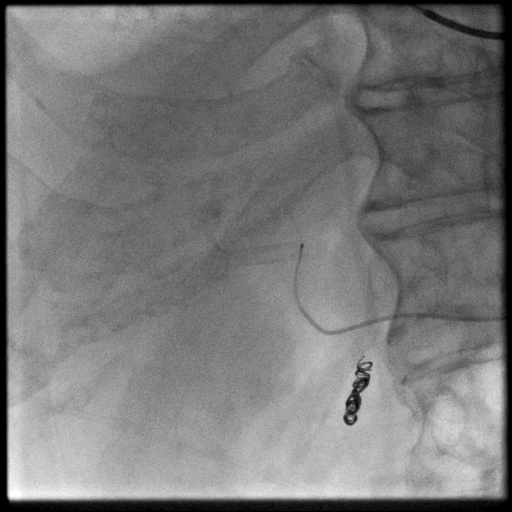

[14 of 24 positions shown; findings below may reference images not displayed]

EXAM:
IR EMBO TUMOR ORGAN ISCHEMIA INFARCT INC GUIDE ROADMAPPING;
ADDITIONAL ARTERIOGRAPHY; IR ULTRASOUND GUIDANCE VASC ACCESS LEFT;
SELECTIVE VISCERAL ARTERIOGRAPHY

MEDICATIONS:
Zosyn 3.375 g. The antibiotic was administered within 1 hour of the
procedure. Additionally, the patient received 10 mg Decadron, 40 mg
Protonix, 2.5 mg verapamil, 400 mcg nitroglycerin and 9555 units of
heparin.

ANESTHESIA/SEDATION:
Moderate (conscious) sedation was employed during this procedure. A
total of Versed 0.5 mg and Fentanyl 25 mcg was administered
intravenously.

Moderate Sedation Time: 52 minutes. The patient's level of
consciousness and vital signs were monitored continuously by
radiology nursing throughout the procedure under my direct
supervision.

CONTRAST:  100 mL

FLUOROSCOPY TIME:  Fluoroscopy Time: 7 minutes 0 seconds (931 mGy).

COMPLICATIONS:
None immediate.

Estimated blood loss: None



The left wrist was interrogated with ultrasound. The radial artery
is widely patent and compressible. Local anesthesia was attained by
infiltration with 1% lidocaine. Under real-time sonographic
guidance, the radial artery was punctured with a 21 gauge
micropuncture needle. A 0.018 wire was advanced the radial artery in
the micropuncture needle exchanged for a Terumo 5 French slender
glide sheath. A 120 cm 4 French glide catheter was then advanced
over a Bentson wire into the abdominal aorta. The Bentson wire was
exchanged for a Glidewire and the celiac artery was selected. The
catheter was advanced into the common hepatic artery. A common
hepatic arteriogram was performed confirming the arterial anatomy.
There is successful embolization of the gastroduodenal artery. The 4
French glide catheter was then advanced into the proper hepatic
artery. An arteriogram was again performed. There is a prominent
cystic artery arising from the proper hepatic artery. The right and
left arterial anatomy was successfully identified. A Renegade high
flow micro catheter was then advanced into the right hepatic artery.
Catheter position was confirmed by digital subtraction
arteriography.

Trans arterial radio embolization was then performed per protocol by
injection of small aliquots of resin [AGE] labeled micro spheres.
Antegrade flow was confirmed several times throughout the course of
the procedure. Administration of the radioactive beads was
accomplished without difficulty. There was persistent antegrade flow
within the right hepatic artery at the end of the case.

The micro catheter and 4 French catheter were removed. Hemostasis
was attained with the assistance of a TR band.
IMPRESSION: Successful right lobar trans arterial radio embolization for
chemorefractive metastatic colon cancer.

PLAN:
Clinic visit with labs in 2 weeks.

Plan for left lobar trans arterial radio embolization in 4 weeks.
